# Patient Record
Sex: Female | Born: 1980 | Race: White | Hispanic: No | Marital: Married | Smoking: Former smoker
Health system: Southern US, Community
[De-identification: ages and names within clinical notes are randomized; demographics above are authoritative.]

## PROBLEM LIST (undated history)

## (undated) DIAGNOSIS — I8390 Asymptomatic varicose veins of unspecified lower extremity: Secondary | ICD-10-CM

## (undated) DIAGNOSIS — F32A Depression, unspecified: Secondary | ICD-10-CM

## (undated) DIAGNOSIS — F419 Anxiety disorder, unspecified: Secondary | ICD-10-CM

## (undated) HISTORY — PX: CHOLECYSTECTOMY: SHX55

## (undated) HISTORY — DX: Anxiety disorder, unspecified: F41.9

## (undated) HISTORY — DX: Depression, unspecified: F32.A

## (undated) HISTORY — DX: Asymptomatic varicose veins of unspecified lower extremity: I83.90

---

## 2016-04-18 ENCOUNTER — Encounter: Payer: Self-pay | Admitting: Family Medicine

## 2016-04-18 ENCOUNTER — Ambulatory Visit (INDEPENDENT_AMBULATORY_CARE_PROVIDER_SITE_OTHER): Payer: BLUE CROSS/BLUE SHIELD | Admitting: Family Medicine

## 2016-04-18 VITALS — BP 106/78 | HR 68 | Temp 98.2°F | Resp 16 | Ht 64.0 in | Wt 126.0 lb

## 2016-04-18 DIAGNOSIS — R5383 Other fatigue: Secondary | ICD-10-CM | POA: Insufficient documentation

## 2016-04-18 DIAGNOSIS — I8390 Asymptomatic varicose veins of unspecified lower extremity: Secondary | ICD-10-CM | POA: Insufficient documentation

## 2016-04-18 DIAGNOSIS — N912 Amenorrhea, unspecified: Secondary | ICD-10-CM

## 2016-04-18 DIAGNOSIS — I8393 Asymptomatic varicose veins of bilateral lower extremities: Secondary | ICD-10-CM

## 2016-04-18 DIAGNOSIS — N911 Secondary amenorrhea: Secondary | ICD-10-CM | POA: Insufficient documentation

## 2016-04-18 DIAGNOSIS — Z23 Encounter for immunization: Secondary | ICD-10-CM | POA: Diagnosis not present

## 2016-04-18 DIAGNOSIS — R5382 Chronic fatigue, unspecified: Secondary | ICD-10-CM

## 2016-04-18 DIAGNOSIS — F411 Generalized anxiety disorder: Secondary | ICD-10-CM

## 2016-04-18 DIAGNOSIS — D509 Iron deficiency anemia, unspecified: Secondary | ICD-10-CM

## 2016-04-18 MED ORDER — VENLAFAXINE HCL ER 75 MG PO CP24: 75.0000 mg | ORAL_CAPSULE | Freq: Every day | ORAL | 1 refills | Status: DC

## 2016-04-18 NOTE — Patient Instructions (Addendum)
Thank you for coming in today. Get labs today. It will be about a week before all labs come back. You should hear from Dr. Benay SpiceBarry's office soon. Start Effexor.  Return in 2 weeks.   Venlafaxine extended-release capsules What is this medicine? VENLAFAXINE(VEN la fax een) is used to treat depression, anxiety and panic disorder. This medicine may be used for other purposes; ask your health care provider or pharmacist if you have questions. What should I tell my health care provider before I take this medicine? They need to know if you have any of these conditions: -bleeding disorders -glaucoma -heart disease -high blood pressure -high cholesterol -kidney disease -liver disease -low levels of sodium in the blood -mania or bipolar disorder -seizures -suicidal thoughts, plans, or attempt; a previous suicide attempt by you or a family -take medicines that treat or prevent blood clots -thyroid disease -an unusual or allergic reaction to venlafaxine, desvenlafaxine, other medicines, foods, dyes, or preservatives -pregnant or trying to get pregnant -breast-feeding How should I use this medicine? Take this medicine by mouth with a full glass of water. Follow the directions on the prescription label. Do not cut, crush, or chew this medicine. Take it with food. If needed, the capsule may be carefully opened and the entire contents sprinkled on a spoonful of cool applesauce. Swallow the applesauce/pellet mixture right away without chewing and follow with a glass of water to ensure complete swallowing of the pellets. Try to take your medicine at about the same time each day. Do not take your medicine more often than directed. Do not stop taking this medicine suddenly except upon the advice of your doctor. Stopping this medicine too quickly may cause serious side effects or your condition may worsen. A special MedGuide will be given to you by the pharmacist with each prescription and refill. Be sure to  read this information carefully each time. Talk to your pediatrician regarding the use of this medicine in children. Special care may be needed. Overdosage: If you think you have taken too much of this medicine contact a poison control center or emergency room at once. NOTE: This medicine is only for you. Do not share this medicine with others. What if I miss a dose? If you miss a dose, take it as soon as you can. If it is almost time for your next dose, take only that dose. Do not take double or extra doses. What may interact with this medicine? Do not take this medicine with any of the following medications: -certain medicines for fungal infections like fluconazole, itraconazole, ketoconazole, posaconazole, voriconazole -cisapride -desvenlafaxine -dofetilide -dronedarone -duloxetine -levomilnacipran -linezolid -MAOIs like Carbex, Eldepryl, Marplan, Nardil, and Parnate -methylene blue (injected into a vein) -milnacipran -pimozide -thioridazine -ziprasidone This medicine may also interact with the following medications: -aspirin and aspirin-like medicines -certain medicines for depression, anxiety, or psychotic disturbances -certain medicines for migraine headaches like almotriptan, eletriptan, frovatriptan, naratriptan, rizatriptan, sumatriptan, zolmitriptan -certain medicines for sleep -certain medicines that treat or prevent blood clots like dalteparin, enoxaparin, warfarin -cimetidine -clozapine -diuretics -fentanyl -furazolidone -indinavir -isoniazid -lithium -metoprolol -NSAIDS, medicines for pain and inflammation, like ibuprofen or naproxen -other medicines that prolong the QT interval (cause an abnormal heart rhythm) -procarbazine -rasagiline -supplements like St. John's wort, kava kava, valerian -tramadol -tryptophan This list may not describe all possible interactions. Give your health care provider a list of all the medicines, herbs, non-prescription drugs, or  dietary supplements you use. Also tell them if you smoke, drink alcohol, or use illegal  drugs. Some items may interact with your medicine. What should I watch for while using this medicine? Tell your doctor if your symptoms do not get better or if they get worse. Visit your doctor or health care professional for regular checks on your progress. Because it may take several weeks to see the full effects of this medicine, it is important to continue your treatment as prescribed by your doctor. Patients and their families should watch out for new or worsening thoughts of suicide or depression. Also watch out for sudden changes in feelings such as feeling anxious, agitated, panicky, irritable, hostile, aggressive, impulsive, severely restless, overly excited and hyperactive, or not being able to sleep. If this happens, especially at the beginning of treatment or after a change in dose, call your health care professional. This medicine can cause an increase in blood pressure. Check with your doctor for instructions on monitoring your blood pressure while taking this medicine. You may get drowsy or dizzy. Do not drive, use machinery, or do anything that needs mental alertness until you know how this medicine affects you. Do not stand or sit up quickly, especially if you are an older patient. This reduces the risk of dizzy or fainting spells. Alcohol may interfere with the effect of this medicine. Avoid alcoholic drinks. Your mouth may get dry. Chewing sugarless gum, sucking hard candy and drinking plenty of water will help. Contact your doctor if the problem does not go away or is severe. What side effects may I notice from receiving this medicine? Side effects that you should report to your doctor or health care professional as soon as possible: -allergic reactions like skin rash, itching or hives, swelling of the face, lips, or tongue -breathing problems -changes in vision -hallucination, loss of contact with  reality -seizures -suicidal thoughts or other mood changes -trouble passing urine or change in the amount of urine -unusual bleeding or bruising Side effects that usually do not require medical attention (report to your doctor or health care professional if they continue or are bothersome): -change in sex drive or performance -constipation -increased sweating -loss of appetite -nausea -tremors -weight loss This list may not describe all possible side effects. Call your doctor for medical advice about side effects. You may report side effects to FDA at 1-800-FDA-1088. Where should I keep my medicine? Keep out of the reach of children. Store at a controlled temperature between 20 and 25 degrees C (68 degrees and 77 degrees F), in a dry place. Throw away any unused medicine after the expiration date. NOTE: This sheet is a summary. It may not cover all possible information. If you have questions about this medicine, talk to your doctor, pharmacist, or health care provider.    2016, Elsevier/Gold Standard. (2013-03-11 12:46:03)

## 2016-04-18 NOTE — Progress Notes (Signed)
Krystal ChattersCassandra Hunter is a 35 y.o. female who presents to [facility]: Primary Care Sports Medicine today for establish care and discuss anxiety, amenorrhea, and fatigue as well as varicose veins.  1) anxiety: Patient has a history of intermittent anxiety in the past. Previously she was treated with Zoloft for well however had sexual side effects that were obnoxious. Spent quite some time since she had significant anxiety problem.  She notes feeling irritable and anxious and worrying. She denies any SI or HI. Symptoms are moderate.  2) fatigue: Patient is ongoing fatigue for the last several years. She gets about 8 hours sleep at night and feels well rested. She denies any history of snoring. He notes that her thyroid labs of been checked in the last several years and reportedly normal.  3) amenorrhea: Patient has had several years of amenorrhea. This previously was evaluated by OB/GYN who noted "low FSH and LH" levels but apparently that was the end of the workup. She avoids pregnancy by husband vasectomy.  4) varicose veins: Patient notes several year history of left leg varicose veins. This is itchy and painful at times. She's tried compression at times which have not been helpful.   Past Medical History:  Diagnosis Date  . Anxiety    Past Surgical History:  Procedure Laterality Date  . CHOLECYSTECTOMY     Social History  Substance Use Topics  . Smoking status: Former Games developermoker  . Smokeless tobacco: Never Used  . Alcohol use Not on file   family history includes Thyroid disease in her mother.  ROS as above: No headache, visual changes, nausea, vomiting, diarrhea, constipation, dizziness, abdominal pain, skin rash, fevers, chills, night sweats, weight loss, swollen lymph nodes, body aches, joint swelling, muscle aches, chest pain, shortness of breath, other mood changes, visual or auditory  hallucinations.    Medications: Current Outpatient Prescriptions  Medication Sig Dispense Refill  . glucosamine-chondroitin 500-400 MG tablet Take 1 tablet by mouth 3 (three) times daily.    Marland Kitchen. venlafaxine XR (EFFEXOR XR) 75 MG 24 hr capsule Take 1 capsule (75 mg total) by mouth daily with breakfast. 30 capsule 1   No current facility-administered medications for this visit.    No Known Allergies   Exam:  BP 106/78 (BP Location: Right Arm, Patient Position: Sitting, Cuff Size: Large)   Pulse 68   Temp 98.2 F (36.8 C) (Oral)   Resp 16   Ht 5\' 4"  (1.626 m)   Wt 126 lb (57.2 kg)   LMP  (LMP Unknown) Comment: amenorrhea  SpO2 100%   BMI 21.63 kg/m  Gen: Well NAD Nontoxic appearing HEENT: EOMI,  MMM no goiter Lungs: Normal work of breathing. CTABL Heart: RRR no MRG Abd: NABS, Soft. Nondistended, Nontender Exts: Brisk capillary refill, warm and well perfused.  Psych: Alert and oriented normal speech thought process and affect Skin left leg large varicose vein. Nontender.  GAD 7 : Generalized Anxiety Score 04/18/2016  Nervous, Anxious, on Edge 2  Control/stop worrying 3  Worry too much - different things 2  Trouble relaxing 3  Restless 0  Easily annoyed or irritable 3  Afraid - awful might happen 1  Total GAD 7 Score 14  Anxiety Difficulty Very difficult    Depression screen PHQ 2/9 04/18/2016  Decreased Interest 1  Down, Depressed, Hopeless 0  PHQ - 2 Score 1  Altered sleeping 0  Tired, decreased energy 3  Change in appetite 0  Feeling bad  or failure about yourself  0  Trouble concentrating 1  Moving slowly or fidgety/restless 0  Suicidal thoughts 0  PHQ-9 Score 5     No results found for this or any previous visit (from the past 24 hour(s)). No results found.    Assessment and Plan: 35 y.o. female with  1) anxiety: Likely recurrent generalized anxiety disorder. Discussed options. Plan for trial of Effexor XR and referral to counseling. Check in 2  weeks  2) fatigue: Unclear etiology. Patient is getting plenty of sleep. Check thyroid labs and anemia labs.  3) amenorrhea: Also unclear etiology. Coupled with fatigue concern for possible pituitary problem. Plan to check TSH, free T4 and T3 as well as prolactin and FSH LH and estrogen progesterone.  4) varicose vein left leg: Refer to Dr. Allyson SabalBerry for possible less invasive approach  5) influenza vaccine given prior to discharge  Orders Placed This Encounter  Procedures  . Flu Vaccine QUAD 36+ mos IM  . CBC  . Comprehensive metabolic panel    Order Specific Question:   Has the patient fasted?    Answer:   No  . Hemoglobin A1c  . HIV antibody  . Iron and TIBC  . VITAMIN D 25 Hydroxy (Vit-D Deficiency, Fractures)  . TSH  . T4, free  . T3, free  . Prolactin  . Follicle stimulating hormone  . LH  . Estrogens, total  . Progesterone  . Ambulatory referral to Cardiology    Referral Priority:   Routine    Referral Type:   Consultation    Referral Reason:   Specialty Services Required    Referred to Provider:   Runell GessJonathan J Berry, MD    Requested Specialty:   Cardiology    Number of Visits Requested:   1    Discussed warning signs or symptoms. Please see discharge instructions. Patient expresses understanding.

## 2016-04-19 DIAGNOSIS — D509 Iron deficiency anemia, unspecified: Secondary | ICD-10-CM | POA: Insufficient documentation

## 2016-04-19 LAB — PROGESTERONE

## 2016-04-19 LAB — COMPREHENSIVE METABOLIC PANEL
ALBUMIN: 4.3 g/dL (ref 3.6–5.1)
ALT: 32 U/L — ABNORMAL HIGH (ref 6–29)
AST: 32 U/L — AB (ref 10–30)
Alkaline Phosphatase: 61 U/L (ref 33–115)
BILIRUBIN TOTAL: 0.5 mg/dL (ref 0.2–1.2)
BUN: 14 mg/dL (ref 7–25)
CALCIUM: 8.7 mg/dL (ref 8.6–10.2)
CO2: 24 mmol/L (ref 20–31)
Chloride: 102 mmol/L (ref 98–110)
Creat: 0.84 mg/dL (ref 0.50–1.10)
GLUCOSE: 98 mg/dL (ref 65–99)
POTASSIUM: 4.3 mmol/L (ref 3.5–5.3)
Sodium: 137 mmol/L (ref 135–146)
Total Protein: 6.9 g/dL (ref 6.1–8.1)

## 2016-04-19 LAB — CBC
HEMATOCRIT: 34.2 % — AB (ref 35.0–45.0)
HEMOGLOBIN: 10.7 g/dL — AB (ref 11.7–15.5)
MCH: 26.7 pg — AB (ref 27.0–33.0)
MCHC: 31.3 g/dL — AB (ref 32.0–36.0)
MCV: 85.3 fL (ref 80.0–100.0)
MPV: 9.2 fL (ref 7.5–12.5)
Platelets: 398 10*3/uL (ref 140–400)
RBC: 4.01 MIL/uL (ref 3.80–5.10)
RDW: 14.6 % (ref 11.0–15.0)
WBC: 4.1 10*3/uL (ref 3.8–10.8)

## 2016-04-19 LAB — TSH: TSH: 1.04 mIU/L

## 2016-04-19 LAB — VITAMIN D 25 HYDROXY (VIT D DEFICIENCY, FRACTURES): Vit D, 25-Hydroxy: 43 ng/mL (ref 30–100)

## 2016-04-19 LAB — IRON AND TIBC
%SAT: 5 % — ABNORMAL LOW (ref 11–50)
IRON: 29 ug/dL — AB (ref 40–190)
TIBC: 576 ug/dL — AB (ref 250–450)
UIBC: 547 ug/dL — ABNORMAL HIGH (ref 125–400)

## 2016-04-19 LAB — HEMOGLOBIN A1C
Hgb A1c MFr Bld: 5.1 % (ref <5.7)
MEAN PLASMA GLUCOSE: 100 mg/dL

## 2016-04-19 LAB — HIV ANTIBODY (ROUTINE TESTING W REFLEX): HIV: NONREACTIVE

## 2016-04-19 LAB — PROLACTIN: Prolactin: 7.6 ng/mL

## 2016-04-19 LAB — FOLLICLE STIMULATING HORMONE: FSH: 6.1 m[IU]/mL

## 2016-04-19 LAB — T3, FREE: T3 FREE: 2.6 pg/mL (ref 2.3–4.2)

## 2016-04-19 LAB — T4, FREE: FREE T4: 1 ng/dL (ref 0.8–1.8)

## 2016-04-19 LAB — LUTEINIZING HORMONE: LH: 0.8 m[IU]/mL

## 2016-04-22 LAB — ESTROGENS, TOTAL: ESTROGEN: 71.9 pg/mL

## 2016-04-24 NOTE — Addendum Note (Signed)
Addended by: Rodolph BongOREY, EVAN S on: 04/24/2016 08:31 AM   Modules accepted: Orders

## 2016-04-26 ENCOUNTER — Other Ambulatory Visit (INDEPENDENT_AMBULATORY_CARE_PROVIDER_SITE_OTHER): Payer: BLUE CROSS/BLUE SHIELD

## 2016-04-26 DIAGNOSIS — D509 Iron deficiency anemia, unspecified: Secondary | ICD-10-CM

## 2016-04-26 DIAGNOSIS — N911 Secondary amenorrhea: Secondary | ICD-10-CM

## 2016-04-26 LAB — FERRITIN: Ferritin: 3 ng/mL — ABNORMAL LOW (ref 10–154)

## 2016-04-26 LAB — POC HEMOCCULT BLD/STL (HOME/3-CARD/SCREEN)
Card #2 Fecal Occult Blod, POC: NEGATIVE
FECAL OCCULT BLD: NEGATIVE
FECAL OCCULT BLD: NEGATIVE

## 2016-04-26 LAB — TESTOSTERONE: TESTOSTERONE: 22 ng/dL

## 2016-04-26 MED ORDER — LORAZEPAM 0.5 MG PO TABS: ORAL_TABLET | ORAL | 0 refills | Status: DC

## 2016-04-26 NOTE — Progress Notes (Signed)
I called the patient and discussed lab results and plan.  Secondary amenorrhea: Plan for brain MRI. Patient has claustrophobia will use Ativan.  follow-up after MRI   Anemia: Unclear etiology. Plan to try oral iron sulfate

## 2016-04-26 NOTE — Addendum Note (Signed)
Addended by: Rodolph BongOREY, Taeja Debellis S on: 04/26/2016 01:11 PM   Modules accepted: Orders

## 2016-05-02 ENCOUNTER — Encounter: Payer: Self-pay | Admitting: Family Medicine

## 2016-05-08 ENCOUNTER — Ambulatory Visit (INDEPENDENT_AMBULATORY_CARE_PROVIDER_SITE_OTHER): Payer: BLUE CROSS/BLUE SHIELD

## 2016-05-08 DIAGNOSIS — N911 Secondary amenorrhea: Secondary | ICD-10-CM | POA: Diagnosis not present

## 2016-05-08 MED ORDER — GADOBENATE DIMEGLUMINE 529 MG/ML IV SOLN
10.0000 mL | Freq: Once | INTRAVENOUS | Status: AC | PRN
  Administered 2016-05-08: 10 mL via INTRAVENOUS

## 2016-05-09 ENCOUNTER — Ambulatory Visit (INDEPENDENT_AMBULATORY_CARE_PROVIDER_SITE_OTHER): Payer: BLUE CROSS/BLUE SHIELD | Admitting: Cardiovascular Disease

## 2016-05-09 ENCOUNTER — Encounter: Payer: Self-pay | Admitting: Cardiovascular Disease

## 2016-05-09 VITALS — BP 125/86 | HR 62 | Ht 64.0 in | Wt 125.5 lb

## 2016-05-09 DIAGNOSIS — I8390 Asymptomatic varicose veins of unspecified lower extremity: Secondary | ICD-10-CM

## 2016-05-09 DIAGNOSIS — I868 Varicose veins of other specified sites: Secondary | ICD-10-CM

## 2016-05-09 NOTE — Patient Instructions (Signed)
Medication Instructions:  Your physician recommends that you continue on your current medications as directed. Please refer to the Current Medication list given to you today.  Labwork: NONE ORDERED  Testing/Procedures: NONE ORDERED  Follow-Up: NONE  Any Other Special Instructions Will Be Listed Below (If Applicable).  NO CHARGE FOR TODAYS VISIT   REFERRAL TO DR EARLY AT [facility]   If you need a refill on your cardiac medications before your next appointment, please call your pharmacy.

## 2016-05-09 NOTE — Progress Notes (Signed)
Ms Krystal Hunter was referred here for varicose veins. I informed her that we did not treat those here in our practice but I will refer her to Dr. Arbie CookeyEarly at [facility] for further evaluation.

## 2016-05-11 ENCOUNTER — Ambulatory Visit (INDEPENDENT_AMBULATORY_CARE_PROVIDER_SITE_OTHER): Payer: BLUE CROSS/BLUE SHIELD | Admitting: Family Medicine

## 2016-05-11 VITALS — BP 128/87 | HR 71 | Wt 121.0 lb

## 2016-05-11 DIAGNOSIS — R930 Abnormal findings on diagnostic imaging of skull and head, not elsewhere classified: Secondary | ICD-10-CM | POA: Diagnosis not present

## 2016-05-11 DIAGNOSIS — N911 Secondary amenorrhea: Secondary | ICD-10-CM | POA: Diagnosis not present

## 2016-05-11 DIAGNOSIS — R5382 Chronic fatigue, unspecified: Secondary | ICD-10-CM

## 2016-05-11 DIAGNOSIS — Z23 Encounter for immunization: Secondary | ICD-10-CM | POA: Diagnosis not present

## 2016-05-11 DIAGNOSIS — R9089 Other abnormal findings on diagnostic imaging of central nervous system: Secondary | ICD-10-CM

## 2016-05-11 DIAGNOSIS — D509 Iron deficiency anemia, unspecified: Secondary | ICD-10-CM

## 2016-05-11 NOTE — Addendum Note (Signed)
Addended by: Minna AntisBRIGHAM, EBONY T on: 05/11/2016 11:50 AM   Modules accepted: Orders

## 2016-05-11 NOTE — Progress Notes (Signed)
Krystal Hunter is a 35 y.o. female who presents to [name]: Primary Care Sports Medicine today for follow-up fatigue, secondary amenorrhea, and iron deficiency.  Secondary amenorrhea. Patient has had a workup so far that shows low estrogen and progesterone levels with low or normal low LH and FSH levels. She is part of the workup had an MRI of her brain which did not show any pituitary abnormalities. She does have some occasions and a ganglion in her brain concerning for nonspecific etiologies.  She notes she occasionally gets night sweats and hot flashes.  Fatigue: Patient has had significant improvement in symptoms with Effexor. She is feeling much better.  Iron deficiency: Unclear etiology was negative Hemoccult workup. Patient is tolerating 3 times a day oral iron well. She notes her ability to exercise and exert herself has improved.   Past Medical History:  Diagnosis Date  . Anxiety    Past Surgical History:  Procedure Laterality Date  . CHOLECYSTECTOMY     Social History  Substance Use Topics  . Smoking status: Former Games developer  . Smokeless tobacco: Never Used  . Alcohol use Not on file   family history includes Thyroid disease in her mother.  ROS as above:  Medications: Current Outpatient Prescriptions  Medication Sig Dispense Refill  . ferrous sulfate 325 (65 FE) MG tablet Take 325 mg by mouth 3 (three) times daily with meals.    Marland Kitchen glucosamine-chondroitin 500-400 MG tablet Take 1 tablet by mouth 3 (three) times daily.    Marland Kitchen venlafaxine XR (EFFEXOR XR) 75 MG 24 hr capsule Take 1 capsule (75 mg total) by mouth daily with breakfast. 30 capsule 1   No current facility-administered medications for this visit.    No Known Allergies   Exam:  BP 128/87   Pulse 71   Wt 121 lb (54.9 kg)   LMP  (LMP Unknown) Comment: amenorrhea  BMI 20.77 kg/m  Gen: Well NAD HEENT: EOMI,   MMM Lungs: Normal work of breathing. CTABL Heart: RRR no MRG Abd: NABS, Soft. Nondistended, Nontender Exts: Brisk capillary refill, warm and well perfused.   Mr Laqueta Jean Wo Contrast  Result Date: 05/08/2016 CLINICAL DATA:  Secondary amenorrhea. EXAM: MRI HEAD WITHOUT AND WITH CONTRAST TECHNIQUE: Multiplanar, multiecho pulse sequences of the brain and surrounding structures were obtained without and with intravenous contrast. CONTRAST:  10mL MULTIHANCE GADOBENATE DIMEGLUMINE 529 MG/ML IV SOLN COMPARISON:  None. FINDINGS: There is no evidence of acute infarct, intracranial hemorrhage, mass, midline shift, or extra-axial fluid collection. The ventricles and sulci are normal. There is symmetric intrinsic T1 hyperintensity in the globi pallidi. The brain is otherwise normal in signal. No abnormal enhancement is identified. Orbits are unremarkable. A left maxillary sinus mucous retention cyst is noted. The mastoid air cells are clear. Major intracranial vascular flow voids are preserved. Bone marrow signal is unremarkable. Dedicated imaging was performed through the sella turcica to evaluate the pituitary gland. A normal posterior pituitary bright spot is present. The pituitary is normal in size and contour and demonstrates homogeneous enhancement without focal abnormality identified. The infundibulum is midline. The optic chiasm is unremarkable. IMPRESSION: 1. Unremarkable appearance of the pituitary gland. 2. Symmetric T1 hyperintensity in the globi pallidi. This is nonspecific but can be seen in the setting of liver dysfunction, manganese deposition from long-term parenteral nutrition, Wilson's disease, calcium deposition, and toxic insults. Electronically Signed   By: Sebastian Ache M.D.   On: 05/08/2016 15:11  No results found for this or any previous visit (from the past 24 hour(s)). No results found.    Assessment and Plan: 35 y.o. female with   Iron deficiency:  Continue oral iron. Recheck iron  stores in 2 months  Secondary amenorrhea: Unclear etiology. At this point refer to OB/GYN as patient is symptomatic. She may benefit from hormone replacement therapy or further workup.  Abnormal MRI: Likely nonspecific and not associated with any other etiology. Probably incidental finding that does not mean anything. However will check for Wilson's disease.  Fatigue/mood: Continue Effexor. Patient seems to be improved.   Orders Placed This Encounter  Procedures  . CBC  . Ferritin  . Iron and TIBC  . Ceruloplasmin  . Ambulatory referral to Obstetrics / Gynecology    Referral Priority:   Routine    Referral Type:   Consultation    Referral Reason:   Specialty Services Required    Requested Specialty:   Obstetrics and Gynecology    Number of Visits Requested:   1    Discussed warning signs or symptoms. Please see discharge instructions. Patient expresses understanding.

## 2016-05-11 NOTE — Patient Instructions (Signed)
Thank you for coming in today. Get labs in 2 months.   Attend OBGYN.

## 2016-05-23 ENCOUNTER — Other Ambulatory Visit: Payer: Self-pay | Admitting: *Deleted

## 2016-05-23 ENCOUNTER — Encounter: Payer: Self-pay | Admitting: Vascular Surgery

## 2016-05-23 DIAGNOSIS — I83892 Varicose veins of left lower extremities with other complications: Secondary | ICD-10-CM

## 2016-05-31 ENCOUNTER — Encounter: Payer: Self-pay | Admitting: Obstetrics & Gynecology

## 2016-05-31 ENCOUNTER — Ambulatory Visit (HOSPITAL_COMMUNITY): Payer: BLUE CROSS/BLUE SHIELD | Admitting: Licensed Clinical Social Worker

## 2016-05-31 ENCOUNTER — Ambulatory Visit (INDEPENDENT_AMBULATORY_CARE_PROVIDER_SITE_OTHER): Payer: BLUE CROSS/BLUE SHIELD | Admitting: Obstetrics & Gynecology

## 2016-05-31 ENCOUNTER — Encounter: Payer: Self-pay | Admitting: Family Medicine

## 2016-05-31 VITALS — Ht 64.0 in | Wt 121.0 lb

## 2016-05-31 DIAGNOSIS — Z124 Encounter for screening for malignant neoplasm of cervix: Secondary | ICD-10-CM | POA: Diagnosis not present

## 2016-05-31 DIAGNOSIS — Z1151 Encounter for screening for human papillomavirus (HPV): Secondary | ICD-10-CM | POA: Diagnosis not present

## 2016-05-31 DIAGNOSIS — Z01419 Encounter for gynecological examination (general) (routine) without abnormal findings: Secondary | ICD-10-CM | POA: Diagnosis not present

## 2016-05-31 NOTE — Progress Notes (Signed)
Subjective:    Krystal Hunter is a 35 y.o. MWP2 (4 and 216 yo kids)  female who presents for an annual exam. She has menarche at abotu 35 yo. Periods were monthly for years, also on OCPS for years. Used mini pill post partum, then husband got a vasectomy. She went off of the pills and had no more pills naturally. She has had a period while on OCPs. She has had hot flashes, night sweats since about 2014. Also bad mood and fatigue.  The patient is sexually active. GYN screening history: last pap: was normal. The patient wears seatbelts: yes. The patient participates in regular exercise: yes. Has the patient ever been transfused or tattooed?: yes. The patient reports that there is not domestic violence in her life.   Menstrual History: OB History    Gravida Para Term Preterm AB Living   2 2 2     2    SAB TAB Ectopic Multiple Live Births                  Menarche age: [age] Patient's last menstrual period was 05/31/2014 (approximate).    The following portions of the patient's history were reviewed and updated as appropriate: allergies, current medications, past family history, past medical history, past social history, past surgical history and problem list.  Review of Systems Pertinent items are noted in HPI. Homemaker. Had flu vaccine already this season Married for 10 years, denies dyspareunia. Uses a lube due to vaginal dryness FH- negative for breast/gyn/colon cancer   Objective:    Ht 5\' 4"  (1.626 m)   Wt 121 lb (54.9 kg)   LMP 05/31/2014 (Approximate)   BMI 20.77 kg/m   General Appearance:    Alert, cooperative, no distress, appears stated age  Head:    Normocephalic, without obvious abnormality, atraumatic  Eyes:    PERRL, conjunctiva/corneas clear, EOM's intact, fundi    benign, both eyes  Ears:    Normal TM's and external ear canals, both ears  Nose:   Nares normal, septum midline, mucosa normal, no drainage    or sinus tenderness  Throat:   Lips, mucosa, and tongue normal;  teeth and gums normal  Neck:   Supple, symmetrical, trachea midline, no adenopathy;    thyroid:  no enlargement/tenderness/nodules; no carotid   bruit or JVD  Back:     Symmetric, no curvature, ROM normal, no CVA tenderness  Lungs:     Clear to auscultation bilaterally, respirations unlabored  Chest Wall:    No tenderness or deformity   Heart:    Regular rate and rhythm, S1 and S2 normal, no murmur, rub   or gallop  Breast Exam:    No tenderness, masses, or nipple abnormality  Abdomen:     Soft, non-tender, bowel sounds active all four quadrants,    no masses, no organomegaly  Genitalia:    Normal female without lesion, discharge or tenderness, moderate vulvovaginal atrophy, NSSR, NT, no palpable adnexal masses     Extremities:   Extremities normal, atraumatic, no cyanosis or edema  Pulses:   2+ and symmetric all extremities  Skin:   Skin color, texture, turgor normal, no rashes or lesions  Lymph nodes:   Cervical, supraclavicular, and axillary nodes normal  Neurologic:   CNII-XII intact, normal strength, sensation and reflexes    throughout   .    Assessment:    Healthy female exam.   Secondary amenorrhea (I suspect early onset menopause although her FSH is still normal).  Plan:     Thin prep Pap smear. with cotesting Refer to Dr. Collier Bullock

## 2016-06-01 ENCOUNTER — Other Ambulatory Visit: Payer: Self-pay

## 2016-06-01 DIAGNOSIS — F411 Generalized anxiety disorder: Secondary | ICD-10-CM

## 2016-06-01 LAB — CYTOLOGY - PAP

## 2016-06-01 MED ORDER — VENLAFAXINE HCL ER 75 MG PO CP24: 75.0000 mg | ORAL_CAPSULE | Freq: Every day | ORAL | 1 refills | Status: DC

## 2016-06-06 ENCOUNTER — Encounter: Payer: Self-pay | Admitting: Family Medicine

## 2016-06-07 MED ORDER — AMOXICILLIN 500 MG PO CAPS: 500.0000 mg | ORAL_CAPSULE | Freq: Three times a day (TID) | ORAL | 0 refills | Status: DC

## 2016-06-27 ENCOUNTER — Encounter: Payer: BLUE CROSS/BLUE SHIELD | Admitting: Vascular Surgery

## 2016-06-27 ENCOUNTER — Encounter (HOSPITAL_COMMUNITY): Payer: BLUE CROSS/BLUE SHIELD

## 2016-07-11 ENCOUNTER — Encounter: Payer: BLUE CROSS/BLUE SHIELD | Admitting: Vascular Surgery

## 2016-07-11 ENCOUNTER — Encounter (HOSPITAL_COMMUNITY): Payer: BLUE CROSS/BLUE SHIELD

## 2016-07-19 ENCOUNTER — Encounter: Payer: Self-pay | Admitting: Vascular Surgery

## 2016-07-26 ENCOUNTER — Encounter: Payer: Self-pay | Admitting: Vascular Surgery

## 2016-07-26 ENCOUNTER — Ambulatory Visit (HOSPITAL_COMMUNITY)
Admission: RE | Admit: 2016-07-26 | Discharge: 2016-07-26 | Disposition: A | Discharge status: Home / Self Care | Payer: BLUE CROSS/BLUE SHIELD | Source: Ambulatory Visit | Attending: Vascular Surgery | Admitting: Vascular Surgery

## 2016-07-26 ENCOUNTER — Ambulatory Visit (INDEPENDENT_AMBULATORY_CARE_PROVIDER_SITE_OTHER): Payer: BLUE CROSS/BLUE SHIELD | Admitting: Vascular Surgery

## 2016-07-26 VITALS — BP 114/77 | HR 74 | Temp 98.7°F | Resp 18 | Ht 64.0 in | Wt 123.1 lb

## 2016-07-26 DIAGNOSIS — I83892 Varicose veins of left lower extremities with other complications: Secondary | ICD-10-CM | POA: Diagnosis not present

## 2016-07-26 NOTE — Progress Notes (Signed)
[facility]  Patient name: Krystal ChattersCassandra Hunter MRN: [id number] DOB: Oct 14, 1980 Sex: female  REASON FOR CONSULT: Evaluation of painful left leg varicose vein  HPI: Krystal ChattersCassandra Weismann is a 35 y.o. female, who is seen today for evaluation of left leg varicose veins. She is very pleasant [age]-year-old reports this initially became apparent after the delivery of her second child. It has been progressive since that time. She has a very large varix that extends across the anterior surface of her thigh her lateral knee and into her lateral calf. This is more prominent with prolonged standing and causes discomfort over the vein with prolonged standing as well. DVT and no history of bleeding from her varicosities. Of interest she did have a grandmother who did have a significant bleed from varices and she is concerned regarding the possibility of this as well.  Past Medical History:  Diagnosis Date  . Anxiety   . Varicose veins     Family History  Problem Relation Age of Onset  . Thyroid disease Mother     SOCIAL HISTORY: Social History   Social History  . Marital status: Married    Spouse name: N/A  . Number of children: N/A  . Years of education: N/A   Occupational History  . Not on file.   Social History Main Topics  . Smoking status: Former Games developermoker  . Smokeless tobacco: Never Used  . Alcohol use Yes     Comment: Occ  . Drug use: No  . Sexual activity: Yes     Comment: Husband has vasectomy   Other Topics Concern  . Not on file   Social History Narrative  . No narrative on file    No Known Allergies  Current Outpatient Prescriptions  Medication Sig Dispense Refill  . ferrous sulfate 325 (65 FE) MG tablet Take 325 mg by mouth 3 (three) times daily with meals.    . venlafaxine XR (EFFEXOR XR) 75 MG 24 hr capsule Take 1 capsule (75 mg total) by mouth daily with breakfast. 90 capsule 1  . amoxicillin (AMOXIL) 500  MG capsule Take 1 capsule (500 mg total) by mouth 3 (three) times daily. (Patient not taking: Reported on 07/26/2016) 21 capsule 0  . glucosamine-chondroitin 500-400 MG tablet Take 1 tablet by mouth 3 (three) times daily.     No current facility-administered medications for this visit.     REVIEW OF SYSTEMS:  [X]  denotes positive finding, [ ]  denotes negative finding Cardiac  Comments:  Chest pain or chest pressure:    Shortness of breath upon exertion:    Short of breath when lying flat:    Irregular heart rhythm:        Vascular    Pain in calf, thigh, or hip brought on by ambulation: x   Pain in feet at night that wakes you up from your sleep:     Blood clot in your veins:    Leg swelling:         Pulmonary    Oxygen at home:    Productive cough:     Wheezing:         Neurologic    Sudden weakness in arms or legs:     Sudden numbness in arms or legs:     Sudden onset of difficulty speaking or slurred speech:    Temporary loss of vision in one eye:     Problems with dizziness:  Gastrointestinal    Blood in stool:     Vomited blood:         Genitourinary    Burning when urinating:     Blood in urine:        Psychiatric    Major depression:         Hematologic    Bleeding problems:    Problems with blood clotting too easily:        Skin    Rashes or ulcers:        Constitutional    Fever or chills:      PHYSICAL EXAM: Vitals:   07/26/16 1041  BP: 114/77  Pulse: 74  Resp: 18  Temp: 98.7 F (37.1 C)  TempSrc: Oral  SpO2: 98%  Weight: 123 lb 1.6 oz (55.8 kg)  Height: 5\' 4"  (1.626 m)    GENERAL: The patient is a well-nourished female, in no acute distress. The vital signs are documented above. CARDIOVASCULAR: Plus dorsalis pedis pulses bilaterally PULMONARY: There is good air exchange  ABDOMEN: Soft and non-tender  MUSCULOSKELETAL: There are no major deformities or cyanosis. NEUROLOGIC: No focal weakness or paresthesias are detected. SKIN:  There are no ulcers or rashes noted. PSYCHIATRIC: The patient has a normal affect. Large varices extending from her proximal medial thigh across her anterior thigh lateral knee and lateral calf No changes of hemosiderin deposits or pigments around her left ankle Darkening of the skin overlying her varices DATA:  He does have reflux to anterior accessory branch of her saphenous vein extending into the varices. Some reflux in her left popliteal vein and no evidence of DVT or other reflux  MEDICAL ISSUES: I discussed these findings in detail with patient. 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 Explained that this is not dangerous and does not put her at increased risk for DVT. Explained that this pattern of disease to be very unlikely to have bleeding similar to her grandmother's situation. She has not tried compression garments for relief of the pain overlying her varicosities. We have measured today and we'll fit her for thigh-high graduated compression garments. She will elevate her legs when possible and also use ibuprofen for discomfort as needed. We will see her again in 3 months for continued evaluation. She would be a candidate for ablation of her anterior accessory branch and stab phlebectomy of multiple tributary varicosities   Larina Earthlyodd F. Kavin Weckwerth, MD FACS [facility] Tel [phone] Pager [phone]

## 2016-10-24 ENCOUNTER — Encounter: Payer: Self-pay | Admitting: Vascular Surgery

## 2016-10-31 ENCOUNTER — Ambulatory Visit: Payer: BLUE CROSS/BLUE SHIELD | Admitting: Vascular Surgery

## 2017-01-24 ENCOUNTER — Other Ambulatory Visit: Payer: Self-pay | Admitting: Family Medicine

## 2017-01-24 DIAGNOSIS — F411 Generalized anxiety disorder: Secondary | ICD-10-CM

## 2017-08-07 ENCOUNTER — Other Ambulatory Visit: Payer: Self-pay | Admitting: Family Medicine

## 2017-08-07 DIAGNOSIS — F411 Generalized anxiety disorder: Secondary | ICD-10-CM

## 2017-09-04 ENCOUNTER — Ambulatory Visit (INDEPENDENT_AMBULATORY_CARE_PROVIDER_SITE_OTHER): Payer: Managed Care, Other (non HMO) | Admitting: Family Medicine

## 2017-09-04 ENCOUNTER — Encounter: Payer: Self-pay | Admitting: Family Medicine

## 2017-09-04 VITALS — BP 126/82 | HR 66 | Ht 64.0 in | Wt 130.0 lb

## 2017-09-04 DIAGNOSIS — F411 Generalized anxiety disorder: Secondary | ICD-10-CM

## 2017-09-04 DIAGNOSIS — Z0000 Encounter for general adult medical examination without abnormal findings: Secondary | ICD-10-CM | POA: Diagnosis not present

## 2017-09-04 DIAGNOSIS — Z23 Encounter for immunization: Secondary | ICD-10-CM | POA: Diagnosis not present

## 2017-09-04 MED ORDER — VENLAFAXINE HCL ER 75 MG PO CP24: ORAL_CAPSULE | ORAL | 0 refills | Status: DC

## 2017-09-04 NOTE — Patient Instructions (Signed)
Thank you for coming in today. Restart Effexor.  If not doing well we can add therapy. You can also do self guided CBT.  Continue exercise.  Recheck in 6-12 months or sooner if needed.

## 2017-09-04 NOTE — Progress Notes (Signed)
Krystal Hunter is a 37 y.o. female who presents to [name]: Primary Care Sports Medicine today for well adult visit.   Krystal Hunter is doing well overall. She notes only anxiety as an issue. She was doing well with effexor and stopped it a few months ago. He symptoms returned and she restarted it a few weeks ago. She is feeling better but is interested in things she can do in addition to medicine to help control anxiety.  She has never done counseling or therapy in the past.  She has tried meditation but found it very challenging.  She finds that exercise tends to help her symptoms quite a bit.   Past Medical History:  Diagnosis Date  . Anxiety   . Varicose veins    Past Surgical History:  Procedure Laterality Date  . CHOLECYSTECTOMY     Social History   Tobacco Use  . Smoking status: Former Games developer  . Smokeless tobacco: Never Used  Substance Use Topics  . Alcohol use: Yes    Comment: Occ   family history includes Thyroid disease in her mother.  ROS as above:  Medications: Current Outpatient Medications  Medication Sig Dispense Refill  . ferrous sulfate 325 (65 FE) MG tablet Take 325 mg by mouth 3 (three) times daily with meals.    Marland Kitchen glucosamine-chondroitin 500-400 MG tablet Take 1 tablet by mouth 3 (three) times daily.    Marland Kitchen venlafaxine XR (EFFEXOR-XR) 75 MG 24 hr capsule TAKE 1 CAPSULE DAILY WITH BREAKFAST 90 capsule 0   No current facility-administered medications for this visit.    No Known Allergies  Health Maintenance Health Maintenance  Topic Date Due  . PAP SMEAR  06/01/2019  . TETANUS/TDAP  05/11/2026  . INFLUENZA VACCINE  Completed  . HIV Screening  Completed     Exam:  BP 126/82   Pulse 66   Ht 5\' 4"  (1.626 m)   Wt 130 lb (59 kg)   BMI 22.31 kg/m  Gen: Well NAD HEENT: EOMI,  MMM Lungs: Normal work of breathing. CTABL Heart: RRR no MRG Abd: NABS, Soft.  Nondistended, Nontender Exts: Brisk capillary refill, warm and well perfused.  Psych alert and oriented normal speech thought process and affect.  No SI or HI expressed.  Depression screen [facility] 2/9 09/04/2017 09/04/2017 04/18/2016  Decreased Interest - 0 1  Down, Depressed, Hopeless 0 0 0  PHQ - 2 Score 0 0 1  Altered sleeping 0 - 0  Tired, decreased energy 0 - 3  Change in appetite 0 - 0  Feeling bad or failure about yourself  0 - 0  Trouble concentrating 0 - 1  Moving slowly or fidgety/restless 0 - 0  Suicidal thoughts 0 - 0  PHQ-9 Score 0 - 5  Difficult doing work/chores Not difficult at all - -   GAD 7 : Generalized Anxiety Score 09/04/2017 04/18/2016  Nervous, Anxious, on Edge 1 2  Control/stop worrying 0 3  Worry too much - different things 2 2  Trouble relaxing 1 3  Restless 1 0  Easily annoyed or irritable 1 3  Afraid - awful might happen 1 1  Total GAD 7 Score 7 14  Anxiety Difficulty Somewhat difficult Very difficult      No results found for this or any previous visit (from the past 72 hour(s)). No results found.    Assessment and Plan: 36 y.o. female with well adult.  Doing quite well.  We had  a lengthy discussion about anxiety management.  Plan to restart Effexor and titrate as needed.  Also recommend continued exercising and self guided cognitive behavioral therapy.  Consider referral to counseling or therapy as needed.  Recheck in 6-12 months or sooner if needed.  Health maintenance up-to-date.  Influenza vaccine given today.   Orders Placed This Encounter  Procedures  . Flu Vaccine QUAD 36+ mos IM    [facility]   Meds ordered this encounter  Medications  . DISCONTD: venlafaxine XR (EFFEXOR-XR) 75 MG 24 hr capsule    Sig: TAKE 1 CAPSULE DAILY WITH BREAKFAST    Dispense:  30 capsule    Refill:  0  . venlafaxine XR (EFFEXOR-XR) 75 MG 24 hr capsule    Sig: TAKE 1 CAPSULE DAILY WITH BREAKFAST    Dispense:  90 capsule    Refill:  0     Discussed warning  signs or symptoms. Please see discharge instructions. Patient expresses understanding.

## 2017-11-05 ENCOUNTER — Encounter: Payer: Self-pay | Admitting: Family Medicine

## 2017-11-05 DIAGNOSIS — F411 Generalized anxiety disorder: Secondary | ICD-10-CM

## 2017-11-05 MED ORDER — VENLAFAXINE HCL ER 150 MG PO CP24: 150.0000 mg | ORAL_CAPSULE | Freq: Every day | ORAL | 0 refills | Status: DC

## 2017-11-22 ENCOUNTER — Telehealth: Payer: Self-pay | Admitting: Family Medicine

## 2017-11-22 NOTE — Telephone Encounter (Signed)
Left detailed message on patient vm with results as noted below. Tayari Yankee,CMA  

## 2017-11-22 NOTE — Telephone Encounter (Signed)
Yes. I have sent a prescription for Effexor 150 to your pharmacy.  Please increase to 150.  Make a follow up appointment with me soonish.   Pt did not read mychart message

## 2017-12-13 ENCOUNTER — Encounter: Payer: Self-pay | Admitting: Family Medicine

## 2017-12-13 DIAGNOSIS — F411 Generalized anxiety disorder: Secondary | ICD-10-CM

## 2017-12-13 MED ORDER — VENLAFAXINE HCL ER 150 MG PO CP24: 150.0000 mg | ORAL_CAPSULE | Freq: Every day | ORAL | 0 refills | Status: DC

## 2018-02-26 ENCOUNTER — Encounter: Payer: Self-pay | Admitting: Family Medicine

## 2018-02-26 ENCOUNTER — Ambulatory Visit (INDEPENDENT_AMBULATORY_CARE_PROVIDER_SITE_OTHER): Payer: Managed Care, Other (non HMO) | Admitting: Family Medicine

## 2018-02-26 VITALS — BP 106/63 | HR 77 | Ht 64.0 in | Wt 127.0 lb

## 2018-02-26 DIAGNOSIS — F411 Generalized anxiety disorder: Secondary | ICD-10-CM | POA: Diagnosis not present

## 2018-02-26 DIAGNOSIS — D509 Iron deficiency anemia, unspecified: Secondary | ICD-10-CM

## 2018-02-26 MED ORDER — VENLAFAXINE HCL ER 150 MG PO CP24: 150.0000 mg | ORAL_CAPSULE | Freq: Every day | ORAL | 3 refills | Status: DC

## 2018-02-26 NOTE — Progress Notes (Signed)
Krystal Hunter is a 37 y.o. female who presents to [name]: Primary Care Sports Medicine today for follow-up anxiety.  Krystal Hunter is doing well with Effexor for anxiety.  She tolerates the medication well and notes that her anxiety is quite well controlled. Notes that she exercises regularly as a way to manage her anxiety symptoms.  She runs about 3-1/2 miles a day 5 to 6 days a week.  She feels quite well with this regimen and denies any injury.  She notes that she takes oral iron for her history of iron deficiency but would like to avoid further testing if possible she feels well with no fatigue or anemia symptoms.  She does not use sunscreen regularly.   ROS as above:  Exam:  BP 106/63   Pulse 77   Ht 5\' 4"  (1.626 m)   Wt 127 lb (57.6 kg)   BMI 21.80 kg/m  Gen: Well NAD HEENT: EOMI,  MMM Lungs: Normal work of breathing. CTABL Heart: RRR no MRG Abd: NABS, Soft. Nondistended, Nontender Exts: Brisk capillary refill, warm and well perfused.  Skin: Multiple freckles with some skin damage present. Psych alert and oriented normal speech thought process and affect.  Depression screen [facility] 2/9 02/26/2018 09/04/2017 09/04/2017 04/18/2016  Decreased Interest 0 - 0 1  Down, Depressed, Hopeless 0 0 0 0  PHQ - 2 Score 0 0 0 1  Altered sleeping 0 0 - 0  Tired, decreased energy 0 0 - 3  Change in appetite 0 0 - 0  Feeling bad or failure about yourself  0 0 - 0  Trouble concentrating 0 0 - 1  Moving slowly or fidgety/restless 0 0 - 0  Suicidal thoughts 0 0 - 0  PHQ-9 Score 0 0 - 5  Difficult doing work/chores Not difficult at all Not difficult at all - -   GAD 7 : Generalized Anxiety Score 02/26/2018 09/04/2017 04/18/2016  Nervous, Anxious, on Edge 0 1 2  Control/stop worrying 0 0 3  Worry too much - different things 0 2 2  Trouble relaxing 1 1 3   Restless 1 1 0  Easily annoyed or irritable 1 1 3     Afraid - awful might happen 0 1 1  Total GAD 7 Score 3 7 14   Anxiety Difficulty Not difficult at all Somewhat difficult Very difficult      Assessment and Plan: 37 y.o. female with  Anxiety depression doing well.  Continue Effexor recheck yearly.  Iron deficiency: Continue oral iron.  Consider recheck in the near future.  Recommend regular sunscreen and avoiding ultraviolet exposure if possible.   No orders of the defined types were placed in this encounter.  No orders of the defined types were placed in this encounter.    Historical information moved to improve visibility of documentation.  Past Medical History:  Diagnosis Date  . Anxiety   . Varicose veins    Past Surgical History:  Procedure Laterality Date  . CHOLECYSTECTOMY     Social History   Tobacco Use  . Smoking status: Former Games developer  . Smokeless tobacco: Never Used  Substance Use Topics  . Alcohol use: Yes    Comment: Occ   family history includes Thyroid disease in her mother.  Medications: Current Outpatient Medications  Medication Sig Dispense Refill  . cetirizine (ZYRTEC) 10 MG tablet Take 10 mg by mouth daily.    . ferrous sulfate 325 (65 FE) MG tablet Take 325 mg  by mouth 3 (three) times daily with meals.    . fluticasone (FLONASE) 50 MCG/ACT nasal spray Place 2 sprays into both nostrils daily.    Marland Kitchen. glucosamine-chondroitin 500-400 MG tablet Take 1 tablet by mouth 3 (three) times daily.    Marland Kitchen. venlafaxine XR (EFFEXOR-XR) 150 MG 24 hr capsule Take 1 capsule (150 mg total) by mouth daily. 90 capsule 0   No current facility-administered medications for this visit.    No Known Allergies   Discussed warning signs or symptoms. Please see discharge instructions. Patient expresses understanding.

## 2018-02-26 NOTE — Patient Instructions (Addendum)
Thank you for coming in today. Continue current treatment.  Protect your skin.   Recheck in 1 year for well adult visit.

## 2018-04-18 ENCOUNTER — Encounter: Payer: Self-pay | Admitting: Family Medicine

## 2018-04-19 MED ORDER — DOXYCYCLINE HYCLATE 100 MG PO TABS: 50.0000 mg | ORAL_TABLET | Freq: Two times a day (BID) | ORAL | 0 refills | Status: DC

## 2019-02-22 ENCOUNTER — Other Ambulatory Visit: Payer: Self-pay | Admitting: Family Medicine

## 2019-02-22 DIAGNOSIS — F411 Generalized anxiety disorder: Secondary | ICD-10-CM

## 2020-01-25 ENCOUNTER — Other Ambulatory Visit: Payer: Self-pay | Admitting: Family Medicine

## 2020-01-25 DIAGNOSIS — F411 Generalized anxiety disorder: Secondary | ICD-10-CM

## 2020-07-15 ENCOUNTER — Ambulatory Visit (INDEPENDENT_AMBULATORY_CARE_PROVIDER_SITE_OTHER): Payer: Managed Care, Other (non HMO) | Admitting: Family Medicine

## 2020-07-15 ENCOUNTER — Encounter: Payer: Self-pay | Admitting: Family Medicine

## 2020-07-15 ENCOUNTER — Other Ambulatory Visit: Payer: Self-pay

## 2020-07-15 VITALS — BP 119/65 | HR 63 | Temp 98.5°F | Ht 64.76 in | Wt 128.5 lb

## 2020-07-15 DIAGNOSIS — Z0000 Encounter for general adult medical examination without abnormal findings: Secondary | ICD-10-CM | POA: Diagnosis not present

## 2020-07-15 DIAGNOSIS — D509 Iron deficiency anemia, unspecified: Secondary | ICD-10-CM

## 2020-07-15 DIAGNOSIS — F411 Generalized anxiety disorder: Secondary | ICD-10-CM | POA: Diagnosis not present

## 2020-07-15 DIAGNOSIS — Z13220 Encounter for screening for lipoid disorders: Secondary | ICD-10-CM

## 2020-07-15 MED ORDER — ESCITALOPRAM OXALATE 10 MG PO TABS: ORAL_TABLET | ORAL | 2 refills | Status: DC

## 2020-07-15 NOTE — Assessment & Plan Note (Signed)
Options discussed with her.  Will try lexapro but I did warn her of potential for sexual side effects.  We'll plan to follow up in about 6 weeks to reassess.   If she is not tolerating or not providing improvement we can consider trying bupropion or non-ssri like buspar.

## 2020-07-15 NOTE — Assessment & Plan Note (Signed)
Well adult Orders Placed This Encounter  Procedures   COMPLETE METABOLIC PANEL WITH GFR   CBC   Lipid Panel w/reflex Direct LDL   TSH   Fe+TIBC+Fer  Screenings: Lipid Immunizations:  She plans to receive flu vaccine soon.  Anticipatory guidance/Risk factor reduction:  Recommendations per AVS.

## 2020-07-15 NOTE — Progress Notes (Signed)
Krystal Hunter - 39 y.o. female MRN [id number]  Date of birth: 1981-03-23  Subjective Chief Complaint  Patient presents with  . Anxiety  . Establish Care    HPI Krystal Hunter is a 39 y.o. female here today for annual exam.  She has been in fairly good health and has history of anxiety.  She has tried a couple of medications for her anxiety throughout the years.  She most recently tried Effexor but found that this numbed her emotionally.  She had sexual side effects with sertraline previously.   She is a non-smoker and occasionally will have a serving of EtOH.    She does exercise frequently and feels like her diet is pretty healthy.   She does plan on getting flu shot, but doesn't want this today.  She has had COVID vaccine.   Review of Systems  Constitutional: Negative for chills, fever, malaise/fatigue and weight loss.  HENT: Negative for congestion, ear pain and sore throat.   Eyes: Negative for blurred vision, double vision and pain.  Respiratory: Negative for cough and shortness of breath.   Cardiovascular: Negative for chest pain and palpitations.  Gastrointestinal: Negative for abdominal pain, blood in stool, constipation, heartburn and nausea.  Genitourinary: Negative for dysuria and urgency.  Musculoskeletal: Negative for joint pain and myalgias.  Neurological: Negative for dizziness and headaches.  Endo/Heme/Allergies: Does not bruise/bleed easily.  Psychiatric/Behavioral: Negative for depression. The patient is nervous/anxious. The patient does not have insomnia.     No Known Allergies  Past Medical History:  Diagnosis Date  . Anxiety   . Varicose veins     Past Surgical History:  Procedure Laterality Date  . CHOLECYSTECTOMY      Social History   Socioeconomic History  . Marital status: Married    Spouse name: Not on file  . Number of children: Not on file  . Years of education: Not on file  . Highest education level: Not on file  Occupational  History  . Not on file  Tobacco Use  . Smoking status: Former Games developer  . Smokeless tobacco: Never Used  Substance and Sexual Activity  . Alcohol use: Yes    Comment: Occ  . Drug use: No  . Sexual activity: Yes    Comment: Husband has vasectomy  Other Topics Concern  . Not on file  Social History Narrative  . Not on file   Social Determinants of Health   Financial Resource Strain:   . Difficulty of Paying Living Expenses: Not on file  Food Insecurity:   . Worried About Programme researcher, broadcasting/film/video in the Last Year: Not on file  . Ran Out of Food in the Last Year: Not on file  Transportation Needs:   . Lack of Transportation (Medical): Not on file  . Lack of Transportation (Non-Medical): Not on file  Physical Activity:   . Days of Exercise per Week: Not on file  . Minutes of Exercise per Session: Not on file  Stress:   . Feeling of Stress : Not on file  Social Connections:   . Frequency of Communication with Friends and Family: Not on file  . Frequency of Social Gatherings with Friends and Family: Not on file  . Attends Religious Services: Not on file  . Active Member of Clubs or Organizations: Not on file  . Attends Banker Meetings: Not on file  . Marital Status: Not on file    Family History  Problem Relation Age of Onset  .  Thyroid disease Mother     Health Maintenance  Topic Date Due  . Hepatitis C Screening  Never done  . PAP SMEAR-Modifier  06/01/2019  . INFLUENZA VACCINE  11/25/2020 (Originally 03/28/2020)  . TETANUS/TDAP  05/11/2026  . COVID-19 Vaccine  Completed  . HIV Screening  Completed     ----------------------------------------------------------------------------------------------------------------------------------------------------------------------------------------------------------------- Physical Exam BP 119/65 (BP Location: Left Arm, Patient Position: Sitting, Cuff Size: Small)   Pulse 63   Temp 98.5 F (36.9 C)   Ht 5' 4.76" (1.645  m)   Wt 128 lb 8 oz (58.3 kg)   SpO2 100%   BMI 21.54 kg/m   Physical Exam Constitutional:      General: She is not in acute distress. HENT:     Head: Normocephalic and atraumatic.     Right Ear: Tympanic membrane normal.     Left Ear: Tympanic membrane normal.     Nose: Nose normal.  Eyes:     General: No scleral icterus.    Conjunctiva/sclera: Conjunctivae normal.  Neck:     Thyroid: No thyromegaly.  Cardiovascular:     Rate and Rhythm: Normal rate and regular rhythm.     Heart sounds: Normal heart sounds.  Pulmonary:     Effort: Pulmonary effort is normal.     Breath sounds: Normal breath sounds.  Abdominal:     General: Bowel sounds are normal. There is no distension.     Palpations: Abdomen is soft.     Tenderness: There is no abdominal tenderness. There is no guarding.  Musculoskeletal:        General: Normal range of motion.     Cervical back: Normal range of motion and neck supple.  Lymphadenopathy:     Cervical: No cervical adenopathy.  Skin:    General: Skin is warm and dry.     Findings: No rash.  Neurological:     Mental Status: She is alert and oriented to person, place, and time.     Cranial Nerves: No cranial nerve deficit.     Coordination: Coordination normal.  Psychiatric:        Behavior: Behavior normal.     ------------------------------------------------------------------------------------------------------------------------------------------------------------------------------------------------------------------- Assessment and Plan  Well adult exam Well adult Orders Placed This Encounter  Procedures  . COMPLETE METABOLIC PANEL WITH GFR  . CBC  . Lipid Panel w/reflex Direct LDL  . TSH  . Fe+TIBC+Fer  Screenings: Lipid Immunizations:  She plans to receive flu vaccine soon.  Anticipatory guidance/Risk factor reduction:  Recommendations per AVS.   GAD (generalized anxiety disorder) Options discussed with her.  Will try lexapro but I  did warn her of potential for sexual side effects.  We'll plan to follow up in about 6 weeks to reassess.   If she is not tolerating or not providing improvement we can consider trying bupropion or non-ssri like buspar.    Anemia, iron deficiency Update CBC and iron panel today.    Meds ordered this encounter  Medications  . escitalopram (LEXAPRO) 10 MG tablet    Sig: Take 1/2 tab x1 week then increase to full tab    Dispense:  30 tablet    Refill:  2    Return in about 6 weeks (around 08/26/2020) for anxiety.    This visit occurred during the SARS-CoV-2 public health emergency.  Safety protocols were in place, including screening questions prior to the visit, additional usage of staff PPE, and extensive cleaning of exam room while observing appropriate contact time as indicated for disinfecting  solutions.

## 2020-07-15 NOTE — Patient Instructions (Signed)

## 2020-07-15 NOTE — Assessment & Plan Note (Signed)
Update CBC and iron panel today.

## 2020-07-16 LAB — CBC
HCT: 37.3 % (ref 35.0–45.0)
Hemoglobin: 12.7 g/dL (ref 11.7–15.5)
MCH: 32 pg (ref 27.0–33.0)
MCHC: 34 g/dL (ref 32.0–36.0)
MCV: 94 fL (ref 80.0–100.0)
MPV: 10.2 fL (ref 7.5–12.5)
Platelets: 355 10*3/uL (ref 140–400)
RBC: 3.97 10*6/uL (ref 3.80–5.10)
RDW: 11.8 % (ref 11.0–15.0)
WBC: 4.5 10*3/uL (ref 3.8–10.8)

## 2020-07-16 LAB — COMPLETE METABOLIC PANEL WITH GFR
AG Ratio: 2.1 (calc) (ref 1.0–2.5)
ALT: 21 U/L (ref 6–29)
AST: 26 U/L (ref 10–30)
Albumin: 4.5 g/dL (ref 3.6–5.1)
Alkaline phosphatase (APISO): 57 U/L (ref 31–125)
BUN: 10 mg/dL (ref 7–25)
CO2: 27 mmol/L (ref 20–32)
Calcium: 9.2 mg/dL (ref 8.6–10.2)
Chloride: 105 mmol/L (ref 98–110)
Creat: 1.03 mg/dL (ref 0.50–1.10)
GFR, Est African American: 79 mL/min/{1.73_m2} (ref >=60)
GFR, Est Non African American: 68 mL/min/{1.73_m2} (ref >=60)
Globulin: 2.1 g/dL (calc) (ref 1.9–3.7)
Glucose, Bld: 75 mg/dL (ref 65–139)
Potassium: 4.3 mmol/L (ref 3.5–5.3)
Sodium: 140 mmol/L (ref 135–146)
Total Bilirubin: 0.4 mg/dL (ref 0.2–1.2)
Total Protein: 6.6 g/dL (ref 6.1–8.1)

## 2020-07-16 LAB — IRON,TIBC AND FERRITIN PANEL
%SAT: 13 % (calc) — ABNORMAL LOW (ref 16–45)
Ferritin: 39 ng/mL (ref 16–154)
Iron: 51 ug/dL (ref 40–190)
TIBC: 389 mcg/dL (calc) (ref 250–450)

## 2020-07-16 LAB — LIPID PANEL W/REFLEX DIRECT LDL
Cholesterol: 225 mg/dL — ABNORMAL HIGH (ref <200)
HDL: 106 mg/dL (ref >=50)
LDL Cholesterol (Calc): 93 mg/dL (calc)
Non-HDL Cholesterol (Calc): 119 mg/dL (calc) (ref <130)
Total CHOL/HDL Ratio: 2.1 (calc) (ref <5.0)
Triglycerides: 163 mg/dL — ABNORMAL HIGH (ref <150)

## 2020-07-16 LAB — TSH: TSH: 1.29 mIU/L

## 2020-08-26 ENCOUNTER — Encounter: Payer: Self-pay | Admitting: Family Medicine

## 2020-08-26 ENCOUNTER — Telehealth (INDEPENDENT_AMBULATORY_CARE_PROVIDER_SITE_OTHER): Payer: Managed Care, Other (non HMO) | Admitting: Family Medicine

## 2020-08-26 DIAGNOSIS — F418 Other specified anxiety disorders: Secondary | ICD-10-CM

## 2020-08-26 MED ORDER — BUPROPION HCL ER (XL) 150 MG PO TB24: 150.0000 mg | ORAL_TABLET | Freq: Every day | ORAL | 1 refills | Status: DC

## 2020-08-26 NOTE — Assessment & Plan Note (Signed)
Tried effexor, lexapro and sertraline.  Discussed alternatives.  Will try bupropion.  She will let me know if she has worsening anxiety with this.   I'll plan to follow up with her in about 4 weeks.  If bupropion is not effective we can consider change to Trintellix or Viibryd.

## 2020-08-26 NOTE — Progress Notes (Signed)
Krystal Hunter - 39 y.o. female MRN [id number]  Date of birth: [birth year]   This visit type was conducted due to national recommendations for restrictions regarding the COVID-19 Pandemic (e.g. social distancing).  This format is felt to be most appropriate for this patient at this time.  All issues noted in this document were discussed and addressed.  No physical exam was performed (except for noted visual exam findings with Video Visits).  I discussed the limitations of evaluation and management by telemedicine and the availability of in person appointments. The patient expressed understanding and agreed to proceed.  I connected with@ on 08/26/20 at  1:20 PM EST by a video enabled telemedicine application and verified that I am speaking with the correct person using two identifiers.  Present at visit: Everrett Coombe, DO [facility]   Patient Location: Home [address] [name] [address]   Provider location:   [facility]  Chief Complaint  Patient presents with  . Anxiety    HPI  Krystal Hunter is a 39 y.o. female who presents via Web designer for a telehealth visit today.  She is following up today for anxiety and depression.  She was changed from effexor to lexapro at her last visit as she felt that this was not effective for her.  Had tried sertraline previously with sexual side effects.  Since starting lexapro she reports that she feels more fatigued and sluggish.  She has tried changing the time of day she takes this without much improvement.  She would like to try something different.   She feels like her depression and low energy levels are more of a problem than her anxiety at this time.   Depression screen [facility] 2/9 08/26/2020 07/15/2020 02/26/2018  Decreased Interest 1 3 0  Down, Depressed, Hopeless 1 3 0  PHQ - 2 Score 2 6 0  Altered sleeping 0 0 0  Tired, decreased energy 3 2 0  Change in appetite 0 0 0  Feeling bad or failure about yourself  1 3 0   Trouble concentrating 1 2 0  Moving slowly or fidgety/restless 0 1 0  Suicidal thoughts 0 0 0  PHQ-9 Score 7 14 0  Difficult doing work/chores Somewhat difficult Somewhat difficult Not difficult at all   GAD 7 : Generalized Anxiety Score 08/26/2020 07/15/2020 02/26/2018 09/04/2017  Nervous, Anxious, on Edge 2 3 0 1  Control/stop worrying 2 3 0 0  Worry too much - different things 1 3 0 2  Trouble relaxing 2 3 1 1   Restless 0 2 1 1   Easily annoyed or irritable 0 3 1 1   Afraid - awful might happen 0 2 0 1  Total GAD 7 Score 7 19 3 7   Anxiety Difficulty Not difficult at all Somewhat difficult Not difficult at all Somewhat difficult     ROS:  A comprehensive ROS was completed and negative except as noted per HPI  Past Medical History:  Diagnosis Date  . Anxiety   . Varicose veins     Past Surgical History:  Procedure Laterality Date  . CHOLECYSTECTOMY      Family History  Problem Relation Age of Onset  . Thyroid disease Mother     Social History   Socioeconomic History  . Marital status: Married    Spouse name: Not on file  . Number of children: Not on file  . Years of education: Not on file  . Highest education level: Not on file  Occupational History  .  Not on file  Tobacco Use  . Smoking status: Former Games developer  . Smokeless tobacco: Never Used  Substance and Sexual Activity  . Alcohol use: Yes    Comment: Occ  . Drug use: No  . Sexual activity: Yes    Comment: Husband has vasectomy  Other Topics Concern  . Not on file  Social History Narrative  . Not on file   Social Determinants of Health   Financial Resource Strain: Not on file  Food Insecurity: Not on file  Transportation Needs: Not on file  Physical Activity: Not on file  Stress: Not on file  Social Connections: Not on file  Intimate Partner Violence: Not on file     Current Outpatient Medications:  .  Chaste Tree (VITEX EXTRACT PO), Take 1,000 mg by mouth., Disp: , Rfl:  .  clindamycin  (CLEOCIN T) 1 % lotion, Apply topically 2 (two) times daily., Disp: , Rfl:  .  escitalopram (LEXAPRO) 10 MG tablet, Take 1/2 tab x1 week then increase to full tab, Disp: 30 tablet, Rfl: 2 .  ferrous sulfate 325 (65 FE) MG tablet, Take 325 mg by mouth daily with breakfast. , Disp: , Rfl:  .  glucosamine-chondroitin 500-400 MG tablet, Take 1 tablet by mouth 3 (three) times daily., Disp: , Rfl:  .  HYDROGEN PEROXIDE EX, Apply topically., Disp: , Rfl:  .  spironolactone (ALDACTONE) 25 MG tablet, Take 25 mg by mouth daily., Disp: , Rfl:  .  tretinoin (RETIN-A) 0.1 % cream, Apply topically at bedtime., Disp: , Rfl:   EXAM:  VITALS per patient if applicable: Wt 125 lb (56.7 kg)   BMI 20.95 kg/m   GENERAL: alert, oriented, appears well and in no acute distress  HEENT: atraumatic, conjunttiva clear, no obvious abnormalities on inspection of external nose and ears  NECK: normal movements of the head and neck  LUNGS: on inspection no signs of respiratory distress, breathing rate appears normal, no obvious gross SOB, gasping or wheezing  CV: no obvious cyanosis  MS: moves all visible extremities without noticeable abnormality  PSYCH/NEURO: pleasant and cooperative, no obvious depression or anxiety, speech and thought processing grossly intact  ASSESSMENT AND PLAN:  Discussed the following assessment and plan:  Depression with anxiety Tried effexor, lexapro and sertraline.  Discussed alternatives.  Will try bupropion.  She will let me know if she has worsening anxiety with this.   I'll plan to follow up with her in about 4 weeks.  If bupropion is not effective we can consider change to Trintellix or Viibryd.      I discussed the assessment and treatment plan with the patient. The patient was provided an opportunity to ask questions and all were answered. The patient agreed with the plan and demonstrated an understanding of the instructions.   The patient was advised to call back or seek  an in-person evaluation if the symptoms worsen or if the condition fails to improve as anticipated.    Everrett Coombe, DO

## 2020-10-05 ENCOUNTER — Other Ambulatory Visit: Payer: Self-pay

## 2020-10-05 MED ORDER — BUPROPION HCL ER (XL) 150 MG PO TB24
150.0000 mg | ORAL_TABLET | Freq: Every day | ORAL | 2 refills | Status: DC
Start: 2020-10-05 — End: 2020-10-19

## 2020-10-19 ENCOUNTER — Encounter: Payer: Self-pay | Admitting: Family Medicine

## 2020-10-19 MED ORDER — BUPROPION HCL ER (XL) 300 MG PO TB24
300.0000 mg | ORAL_TABLET | Freq: Every day | ORAL | 3 refills | Status: DC
Start: 2020-10-19 — End: 2020-11-15

## 2020-11-15 ENCOUNTER — Other Ambulatory Visit: Payer: Self-pay

## 2020-11-15 MED ORDER — BUPROPION HCL ER (XL) 300 MG PO TB24
300.0000 mg | ORAL_TABLET | Freq: Every day | ORAL | 3 refills | Status: DC
Start: 2020-11-15 — End: 2021-01-12

## 2020-11-16 ENCOUNTER — Other Ambulatory Visit: Payer: Self-pay | Admitting: Family Medicine

## 2020-11-16 MED ORDER — ESCITALOPRAM OXALATE 10 MG PO TABS: ORAL_TABLET | ORAL | 2 refills | Status: DC

## 2020-12-14 ENCOUNTER — Other Ambulatory Visit: Payer: Self-pay

## 2020-12-14 DIAGNOSIS — F411 Generalized anxiety disorder: Secondary | ICD-10-CM

## 2020-12-14 DIAGNOSIS — F418 Other specified anxiety disorders: Secondary | ICD-10-CM

## 2020-12-14 MED ORDER — ESCITALOPRAM OXALATE 10 MG PO TABS: 10.0000 mg | ORAL_TABLET | Freq: Every day | ORAL | 2 refills | Status: DC

## 2021-01-12 ENCOUNTER — Other Ambulatory Visit: Payer: Self-pay

## 2021-01-12 MED ORDER — BUPROPION HCL ER (XL) 150 MG PO TB24: 150.0000 mg | ORAL_TABLET | Freq: Every day | ORAL | 0 refills | Status: DC

## 2021-01-12 MED ORDER — BUPROPION HCL ER (XL) 150 MG PO TB24
150.0000 mg | ORAL_TABLET | Freq: Every day | ORAL | 0 refills | Status: DC
Start: 2021-01-12 — End: 2021-03-31

## 2021-01-25 ENCOUNTER — Encounter: Payer: Self-pay | Admitting: Family Medicine

## 2021-01-31 ENCOUNTER — Encounter: Payer: Self-pay | Admitting: Family Medicine

## 2021-01-31 ENCOUNTER — Telehealth (INDEPENDENT_AMBULATORY_CARE_PROVIDER_SITE_OTHER): Payer: Managed Care, Other (non HMO) | Admitting: Family Medicine

## 2021-01-31 VITALS — Ht 64.0 in | Wt 125.0 lb

## 2021-01-31 DIAGNOSIS — R41840 Attention and concentration deficit: Secondary | ICD-10-CM

## 2021-01-31 DIAGNOSIS — F418 Other specified anxiety disorders: Secondary | ICD-10-CM | POA: Diagnosis not present

## 2021-01-31 MED ORDER — VORTIOXETINE HBR 10 MG PO TABS: 10.0000 mg | ORAL_TABLET | Freq: Every day | ORAL | 3 refills | Status: DC

## 2021-01-31 NOTE — Progress Notes (Signed)
Wants to discuss MyChart medication suggestion.

## 2021-02-02 ENCOUNTER — Telehealth: Payer: Self-pay

## 2021-02-02 NOTE — Assessment & Plan Note (Signed)
Discussed alternative of trintellix.  Will wean from lexapro and bupropion. Starting trintellix if approved.   Referral placed for adult  ADD evaluation as well.

## 2021-02-02 NOTE — Telephone Encounter (Signed)
Message sent to Dr. Ashley Royalty for visit not completion

## 2021-02-02 NOTE — Progress Notes (Signed)
Krystal Hunter - 40 y.o. female MRN [id number]  Date of birth: 1980-11-03   This visit type was conducted due to national recommendations for restrictions regarding the COVID-19 Pandemic (e.g. social distancing).  This format is felt to be most appropriate for this patient at this time.  All issues noted in this document were discussed and addressed.  No physical exam was performed (except for noted visual exam findings with Video Visits).  I discussed the limitations of evaluation and management by telemedicine and the availability of in person appointments. The patient expressed understanding and agreed to proceed.  I connected with@ on 02/02/21 at  1:50 PM EDT by a video enabled telemedicine application and verified that I am speaking with the correct person using two identifiers.  Present at visit: Everrett Coombe, DO [name]   Patient Location: Home [address] [name] [address]   Provider location:   [facility]  No chief complaint on file.   HPI  Krystal Hunter is a 40 y.o. female who presents via Web designer for a telehealth visit today.  She is following up for depression and anxiety.  She is currently treated with combination of lexapro and bupropion.  She had done well initially with this however feels that the effects have worn off.  She has had similar experience when taking similar medications.  She thinks there may be a component of ADD that is contributing to these symptoms as well.  She would be open to trying new medication while awaiting ADHD referral.    ROS:  A comprehensive ROS was completed and negative except as noted per HPI      Past Medical History:  Diagnosis Date  . Anxiety   . Varicose veins     Past Surgical History:  Procedure Laterality Date  . CHOLECYSTECTOMY      Family History  Problem Relation Age of Onset  . Thyroid disease Mother     Social History   Socioeconomic History  . Marital status: Married     Spouse name: Not on file  . Number of children: Not on file  . Years of education: Not on file  . Highest education level: Not on file  Occupational History  . Not on file  Tobacco Use  . Smoking status: Former Games developer  . Smokeless tobacco: Never Used  Substance and Sexual Activity  . Alcohol use: Yes    Comment: Occ  . Drug use: No  . Sexual activity: Yes    Comment: Husband has vasectomy  Other Topics Concern  . Not on file  Social History Narrative  . Not on file   Social Determinants of Health   Financial Resource Strain: Not on file  Food Insecurity: Not on file  Transportation Needs: Not on file  Physical Activity: Not on file  Stress: Not on file  Social Connections: Not on file  Intimate Partner Violence: Not on file     Current Outpatient Medications:  .  buPROPion (WELLBUTRIN XL) 150 MG 24 hr tablet, Take 1 tablet (150 mg total) by mouth daily., Disp: 90 tablet, Rfl: 0 .  Chaste Tree (VITEX EXTRACT PO), Take 1,000 mg by mouth., Disp: , Rfl:  .  escitalopram (LEXAPRO) 10 MG tablet, Take 1 tablet (10 mg total) by mouth daily., Disp: 90 tablet, Rfl: 2 .  ferrous sulfate 325 (65 FE) MG tablet, Take 325 mg by mouth daily with breakfast. , Disp: , Rfl:  .  glucosamine-chondroitin 500-400 MG tablet, Take 1 tablet  by mouth 3 (three) times daily., Disp: , Rfl:  .  vortioxetine HBr (TRINTELLIX) 10 MG TABS tablet, Take 1 tablet (10 mg total) by mouth daily., Disp: 30 tablet, Rfl: 3  EXAM:  VITALS per patient if applicable: Ht 5\' 4"  (1.626 m)   Wt 125 lb (56.7 kg)   BMI 21.46 kg/m   GENERAL: alert, oriented, appears well and in no acute distress  HEENT: atraumatic, conjunttiva clear, no obvious abnormalities on inspection of external nose and ears  NECK: normal movements of the head and neck  LUNGS: on inspection no signs of respiratory distress, breathing rate appears normal, no obvious gross SOB, gasping or wheezing  CV: no obvious cyanosis  MS: moves all  visible extremities without noticeable abnormality  PSYCH/NEURO: pleasant and cooperative, no obvious depression or anxiety, speech and thought processing grossly intact  ASSESSMENT AND PLAN:  Discussed the following assessment and plan:  Depression with anxiety Discussed alternative of trintellix.  Will wean from lexapro and bupropion. Starting trintellix if approved.   Referral placed for adult  ADD evaluation as well.      I discussed the assessment and treatment plan with the patient. The patient was provided an opportunity to ask questions and all were answered. The patient agreed with the plan and demonstrated an understanding of the instructions.   The patient was advised to call back or seek an in-person evaluation if the symptoms worsen or if the condition fails to improve as anticipated.    , DO

## 2021-02-03 ENCOUNTER — Encounter: Payer: Self-pay | Admitting: Family Medicine

## 2021-02-04 ENCOUNTER — Ambulatory Visit (INDEPENDENT_AMBULATORY_CARE_PROVIDER_SITE_OTHER): Payer: Managed Care, Other (non HMO)

## 2021-02-04 ENCOUNTER — Ambulatory Visit (INDEPENDENT_AMBULATORY_CARE_PROVIDER_SITE_OTHER): Payer: Managed Care, Other (non HMO) | Admitting: Sports Medicine

## 2021-02-04 ENCOUNTER — Other Ambulatory Visit: Payer: Self-pay

## 2021-02-04 DIAGNOSIS — M25562 Pain in left knee: Secondary | ICD-10-CM | POA: Diagnosis not present

## 2021-02-04 DIAGNOSIS — Z09 Encounter for follow-up examination after completed treatment for conditions other than malignant neoplasm: Secondary | ICD-10-CM | POA: Diagnosis not present

## 2021-02-04 MED ORDER — MELOXICAM 15 MG PO TABS: ORAL_TABLET | ORAL | 3 refills | Status: AC

## 2021-02-04 NOTE — Assessment & Plan Note (Signed)
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 Ultimately it improved and today she is feeling significantly better. On exam she only has a minimal amount of effusion, there really is not any discrete areas of tenderness, but I am able to reproduce her pain with the application of valgus stress and she does have a positive McMurray's sign with pain but no palpable pops. No patellar crepitus. Considering the location of her pain my differential does include a medial meniscal tear and patellofemoral chondromalacia. We discussed the treatment protocol, we will start conservatively with meloxicam, x-rays, home rehab exercises, return to see me in a month, MRI if no better.

## 2021-02-04 NOTE — Progress Notes (Signed)
    Procedures performed today:    None.  Independent interpretation of notes and tests performed by another provider:   None.  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 Ultimately it improved and today she is feeling significantly better. On exam she only has a minimal amount of effusion, there really is not any discrete areas of tenderness, but I am able to reproduce her pain with the application of valgus stress and she does have a positive McMurray's sign with pain but no palpable pops. No patellar crepitus. Considering the location of her pain my differential does include a medial meniscal tear and patellofemoral chondromalacia. We discussed the treatment protocol, we will start conservatively with meloxicam, x-rays, home rehab exercises, return to see me in a month, MRI if no better.    ___________________________________________ Ihor Austin. Benjamin Stain, M.D., ABFM., CAQSM. Primary Care and Sports Medicine [facility]  Adjunct Instructor of Family Medicine  [employer]

## 2021-03-07 ENCOUNTER — Ambulatory Visit: Payer: Managed Care, Other (non HMO) | Admitting: Sports Medicine

## 2021-03-15 ENCOUNTER — Ambulatory Visit (INDEPENDENT_AMBULATORY_CARE_PROVIDER_SITE_OTHER): Payer: Managed Care, Other (non HMO) | Admitting: Sports Medicine

## 2021-03-15 ENCOUNTER — Other Ambulatory Visit: Payer: Self-pay

## 2021-03-15 DIAGNOSIS — M25562 Pain in left knee: Secondary | ICD-10-CM

## 2021-03-15 MED ORDER — TRIAZOLAM 0.25 MG PO TABS: ORAL_TABLET | ORAL | 0 refills | Status: DC

## 2021-03-15 NOTE — Progress Notes (Signed)
    Procedures performed today:    None.  Independent interpretation of notes and tests performed by another provider:   None.  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 Ultimately went running and felt a pop and a snap medial aspect of the anterior knee and pain and inability to bear weight.  Improved to some degree, however continues to have significant discomfort in spite of conservative treatment including greater than 6 weeks of physician directed meniscal tear conditioning exercises, meloxicam, x-rays did show some degenerative processes, on exam she does have a McMurray's sign, and pain with application of valgus stress. I suspect anterior horn medial meniscal tear, MRI will be obtained for procedural planning. Return to go over MRI results, which will likely include an injection. Triazolam for preprocedural anxiolysis.    ___________________________________________ Ihor Krystal Hunter, M.D., ABFM., CAQSM. Primary Care and Sports Medicine [facility]  Adjunct Instructor of Family Medicine  [employer]

## 2021-03-15 NOTE — Assessment & Plan Note (Addendum)
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 Ultimately went running and felt a pop and a snap medial aspect of the anterior knee and pain and inability to bear weight.  Improved to some degree, however continues to have significant discomfort in spite of conservative treatment including greater than 6 weeks of physician directed meniscal tear conditioning exercises, meloxicam, x-rays did show some degenerative processes, on exam she does have a McMurray's sign, and pain with application of valgus stress. I suspect anterior horn medial meniscal tear, MRI will be obtained for procedural planning. Return to go over MRI results, which will likely include an injection. Triazolam for preprocedural anxiolysis.

## 2021-03-26 ENCOUNTER — Other Ambulatory Visit: Payer: Self-pay

## 2021-03-26 ENCOUNTER — Ambulatory Visit (INDEPENDENT_AMBULATORY_CARE_PROVIDER_SITE_OTHER): Payer: Managed Care, Other (non HMO)

## 2021-03-26 DIAGNOSIS — M25562 Pain in left knee: Secondary | ICD-10-CM

## 2021-03-30 ENCOUNTER — Ambulatory Visit (INDEPENDENT_AMBULATORY_CARE_PROVIDER_SITE_OTHER): Payer: Managed Care, Other (non HMO) | Admitting: Sports Medicine

## 2021-03-30 ENCOUNTER — Ambulatory Visit (INDEPENDENT_AMBULATORY_CARE_PROVIDER_SITE_OTHER): Payer: Managed Care, Other (non HMO)

## 2021-03-30 DIAGNOSIS — M25562 Pain in left knee: Secondary | ICD-10-CM

## 2021-03-30 NOTE — Progress Notes (Signed)
    Procedures performed today:    Procedure: Real-time Ultrasound Guided injection of the left knee Device: Samsung HS60  Verbal informed consent obtained.  Time-out conducted.  Noted no overlying erythema, induration, or other signs of local infection.  Skin prepped in a sterile fashion.  Local anesthesia: Topical Ethyl chloride.  With sterile technique and under real time ultrasound guidance: Noted trace effusion, 1 cc Kenalog 40, 2 cc lidocaine, 2 cc bupivacaine injected easily Completed without difficulty  Advised to call if fevers/chills, erythema, induration, drainage, or persistent bleeding.  Images permanently stored and available for review in PACS.  Impression: Technically successful ultrasound guided injection.  Independent interpretation of notes and tests performed by another provider:   None.  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 We treated her conservatively, meloxicam, rehab exercises, she did not improve so we obtained an MRI that showed medial meniscal fraying as well as some osteoarthritis. Due to the lack of significant mechanical symptoms we are going to do an injection today, if this fails we will do viscosupplementation, if that fails I would refer her for arthroscopy. Return to see me in 1 month, she understands to take it easy for 4 days after the injection.   ___________________________________________ Ihor Austin. Benjamin Stain, M.D., ABFM., CAQSM. Primary Care and Sports Medicine [facility] [facility]  Adjunct Instructor of Family Medicine  [employer]

## 2021-03-30 NOTE — Assessment & Plan Note (Signed)
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 We treated her conservatively, meloxicam, rehab exercises, she did not improve so we obtained an MRI that showed medial meniscal fraying as well as some osteoarthritis. Due to the lack of significant mechanical symptoms we are going to do an injection today, if this fails we will do viscosupplementation, if that fails I would refer her for arthroscopy. Return to see me in 1 month, she understands to take it easy for 4 days after the injection.

## 2021-03-31 ENCOUNTER — Other Ambulatory Visit: Payer: Self-pay | Admitting: Family Medicine

## 2021-03-31 ENCOUNTER — Encounter: Payer: Self-pay | Admitting: Family Medicine

## 2021-03-31 MED ORDER — LISDEXAMFETAMINE DIMESYLATE 30 MG PO CAPS: 30.0000 mg | ORAL_CAPSULE | Freq: Every day | ORAL | 0 refills | Status: DC

## 2021-04-27 ENCOUNTER — Other Ambulatory Visit: Payer: Self-pay

## 2021-04-27 ENCOUNTER — Ambulatory Visit (INDEPENDENT_AMBULATORY_CARE_PROVIDER_SITE_OTHER): Payer: Managed Care, Other (non HMO) | Admitting: Sports Medicine

## 2021-04-27 ENCOUNTER — Telehealth: Payer: Self-pay | Admitting: Sports Medicine

## 2021-04-27 DIAGNOSIS — M25562 Pain in left knee: Secondary | ICD-10-CM

## 2021-04-27 DIAGNOSIS — G8929 Other chronic pain: Secondary | ICD-10-CM

## 2021-04-27 NOTE — Assessment & Plan Note (Signed)
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 I have asked her to take it easy for the next couple weeks, we are going to get her approved for viscosupplementation.

## 2021-04-27 NOTE — Telephone Encounter (Signed)
Please work on [employer], any brand. Orthovisc preferred.  X-ray confirmed osteoarthritis, failed everything.

## 2021-04-27 NOTE — Progress Notes (Signed)
    Procedures performed today:    None.  Independent interpretation of notes and tests performed by another provider:   None.  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 I have asked her to take it easy for the next couple weeks, we are going to get her approved for viscosupplementation.    ___________________________________________ Ihor Austin. Benjamin Stain, M.D., ABFM., CAQSM. Primary Care and Sports Medicine [place] [facility]  Adjunct Instructor of Family Medicine  [employer]

## 2021-04-28 ENCOUNTER — Other Ambulatory Visit: Payer: Self-pay | Admitting: Family Medicine

## 2021-04-28 MED ORDER — LISDEXAMFETAMINE DIMESYLATE 30 MG PO CAPS: 30.0000 mg | ORAL_CAPSULE | Freq: Every day | ORAL | 0 refills | Status: DC

## 2021-05-19 MED ORDER — LISDEXAMFETAMINE DIMESYLATE 40 MG PO CAPS: 40.0000 mg | ORAL_CAPSULE | ORAL | 0 refills | Status: DC

## 2021-05-26 NOTE — Telephone Encounter (Signed)
PA information submitted via [employer] Paperwork has been printed and given to Dr. T for signatures. Once obtained, information will be faxed to [employer] at [phone]  

## 2021-06-01 NOTE — Telephone Encounter (Signed)
[employer] paperwork signed by Dr. Karie Schwalbe and faxed to [employer] at [phone] on 05/26/2021 Fax confirmation receipt received

## 2021-06-09 NOTE — Telephone Encounter (Signed)
Benefits Investigation Details received from [employer].  Medical: Deductible applies. Once the deductible has been met, patient is responsible for a coinsurance. Once the OOP has been met, patient is covered at 100%. PA is required for the drug through [place]. Beginning 02/25/2021, Christella Scheuermann will require extensive clinical records accompanying a PA unless a prior treatment within the last 365 days is on file for the drug. PA form faxed: [employer] at [phone].  Fax confirmation received  Pharmacy: [facility] [place]  May fill through: Both  OV Copay/Coinsurance: 15% Product Copay: 15% Administration Coinsurance: 15% Administration Copay: $0 Deductible: $6400 (met: $2952.08) Out of Pocket Max: $6400 (met: [id number])

## 2021-06-09 NOTE — Telephone Encounter (Deleted)
Voicemail box full (1st attempt) Will try to call patient back at a later time.   Pt will need to meet the deductible first and then insurance will pick up at 15%. She has met $2953.08 for her $6400 deductible.   Pt also aware via MyChart message.

## 2021-06-13 ENCOUNTER — Other Ambulatory Visit: Payer: Self-pay | Admitting: Family Medicine

## 2021-06-13 MED ORDER — LISDEXAMFETAMINE DIMESYLATE 40 MG PO CAPS: 40.0000 mg | ORAL_CAPSULE | ORAL | 0 refills | Status: DC

## 2021-06-15 ENCOUNTER — Other Ambulatory Visit: Payer: Self-pay

## 2021-06-15 NOTE — Telephone Encounter (Signed)
Pt has [employer] and they only approve Euflexxa. Completed PA on the phone with Crystal and she said that it is being sent for pharmacy review and that we should have a determination in 5 business days. Pt aware.    She will have to pay OOP to reach her deductible and then insurance will pick up 85% but they still have to have the PA ahead of time.

## 2021-06-21 ENCOUNTER — Ambulatory Visit: Payer: Managed Care, Other (non HMO) | Admitting: Sports Medicine

## 2021-06-22 MED ORDER — EUFLEXXA 20 MG/2ML IX SOSY: PREFILLED_SYRINGE | INTRA_ARTICULAR | 1 refills | Status: DC

## 2021-06-22 NOTE — Addendum Note (Signed)
Addended by: Monica Becton on: 06/22/2021 02:48 PM   Modules accepted: Orders

## 2021-06-22 NOTE — Telephone Encounter (Signed)
PA response received from [facility].  Medication: Euflexxa Prior authorization determination received Medication has been denied Reason for denial:  "Euflexxa is considered medically necessary when all of the following are met (1, 2, and 3): 1. Osteoarthritis of the knee; 2. Diagnosis of the knee to be treated is documented by radiologic evidence of osteoarthritis of the knee (for example, joint space narrowing, subchondral sclerosis, osteophytes, and sub-chondral cysts); and 3. Documentation of one of the following (A or B): A. Individual has had an inadequate response to two of the following modalities of therapy for osteoarthritis (I, ii, iii): I. At least six weeks of provider-directed conservative management program consisting of physical therapy or home exercises, II. At least two of the following pharmacologic therapies: oral or topical NSAID's, acetaminophen, tramadol, and duloxetine; and/or iii. At lease one injection of intraarticular corticosteroids to the affected knee; or B. Individual has a contraindication per FDA labeled, significant intolerance, or is not a candidate for all of the following modalities of therapy for osteoarthritis (I, ii, iii): provider-directed conservative management program consisting of physical therapy or home exercises; ii. Phramcologic therapies for knee osteoarthritis; and iii. Intra-articular corticosteroids. Intra-articular hyaluronic acid products are considered medically necessary for continued use when initial criteria are met and there is documentation of beneficial response, including the following: at lease 6 months have lapsed since the completion of the prior treatment course. Intra-articular hyaluronic acid products are considered experimental, investigational or unproven for any other use including the following: 1. Acute ankle sprain; 2. Osteoarthritis and other pathologic conditions involving joints; and 3. The combination of any other product, (for example,  PRP, stem cell products, amniotic products, or corticosteroids) with a viscosupplementation injection"

## 2021-06-22 NOTE — Telephone Encounter (Signed)
I am going to just send it to [facility] and lets see what they say.

## 2021-06-28 ENCOUNTER — Ambulatory Visit: Payer: Managed Care, Other (non HMO) | Admitting: Sports Medicine

## 2021-07-05 ENCOUNTER — Ambulatory Visit: Payer: Managed Care, Other (non HMO) | Admitting: Sports Medicine

## 2021-07-08 ENCOUNTER — Other Ambulatory Visit: Payer: Self-pay | Admitting: Family Medicine

## 2021-07-12 ENCOUNTER — Ambulatory Visit: Payer: Managed Care, Other (non HMO) | Admitting: Sports Medicine

## 2021-07-12 MED ORDER — LISDEXAMFETAMINE DIMESYLATE 40 MG PO CAPS: 40.0000 mg | ORAL_CAPSULE | ORAL | 0 refills | Status: DC

## 2021-08-05 ENCOUNTER — Other Ambulatory Visit: Payer: Self-pay | Admitting: Family Medicine

## 2021-08-05 MED ORDER — LISDEXAMFETAMINE DIMESYLATE 40 MG PO CAPS: 40.0000 mg | ORAL_CAPSULE | ORAL | 0 refills | Status: DC

## 2021-08-18 NOTE — Telephone Encounter (Signed)
Spoke with [facility] and they said that the Rx was denied, needs a PA. PA has been denied:   "Euflexxa is considered medically necessary when all of the following are met (1, 2, and 3): 1. Osteoarthritis of the knee; 2. Diagnosis of the knee to be treated is documented by radiologic evidence of osteoarthritis of the knee (for example, joint space narrowing, subchondral sclerosis, osteophytes, and sub-chondral cysts); and 3. Documentation of one of the following (A or B): A. Individual has had an inadequate response to two of the following modalities of therapy for osteoarthritis (I, ii, iii): I. At least six weeks of provider-directed conservative management program consisting of physical therapy or home exercises, II. At least two of the following pharmacologic therapies: oral or topical NSAID's, acetaminophen, tramadol, and duloxetine; and/or iii. At lease one injection of intraarticular corticosteroids to the affected knee; or B. Individual has a contraindication per FDA labeled, significant intolerance, or is not a candidate for all of the following modalities of therapy for osteoarthritis (I, ii, iii): provider-directed conservative management program consisting of physical therapy or home exercises; ii. Phramcologic therapies for knee osteoarthritis; and iii. Intra-articular corticosteroids. Intra-articular hyaluronic acid products are considered medically necessary for continued use when initial criteria are met and there is documentation of beneficial response, including the following: at lease 6 months have lapsed since the completion of the prior treatment course. Intra-articular hyaluronic acid products are considered experimental, investigational or unproven for any other use including the following: 1. Acute ankle sprain; 2. Osteoarthritis and other pathologic conditions involving joints; and 3. The combination of any other product, (for example, PRP, stem cell products, amniotic products, or  corticosteroids) with a viscosupplementation injection"

## 2021-08-19 NOTE — Telephone Encounter (Signed)
She has had all of that done already, either try to resubmit or let me do a peer to peer somehow.

## 2021-09-05 ENCOUNTER — Encounter: Payer: Self-pay | Admitting: Family Medicine

## 2021-09-07 ENCOUNTER — Telehealth (INDEPENDENT_AMBULATORY_CARE_PROVIDER_SITE_OTHER): Payer: Managed Care, Other (non HMO) | Admitting: Family Medicine

## 2021-09-07 ENCOUNTER — Encounter: Payer: Self-pay | Admitting: Family Medicine

## 2021-09-07 DIAGNOSIS — F988 Other specified behavioral and emotional disorders with onset usually occurring in childhood and adolescence: Secondary | ICD-10-CM

## 2021-09-07 MED ORDER — AMPHETAMINE-DEXTROAMPHETAMINE 10 MG PO TABS: ORAL_TABLET | ORAL | 0 refills | Status: DC

## 2021-09-07 MED ORDER — LISDEXAMFETAMINE DIMESYLATE 40 MG PO CAPS: 40.0000 mg | ORAL_CAPSULE | ORAL | 0 refills | Status: DC

## 2021-09-07 NOTE — Progress Notes (Signed)
Krystal Hunter - 41 y.o. female MRN [id number]  Date of birth: October 09, 1980   This visit type was conducted due to national recommendations for restrictions regarding the COVID-19 Pandemic (e.g. social distancing).  This format is felt to be most appropriate for this patient at this time.  All issues noted in this document were discussed and addressed.  No physical exam was performed (except for noted visual exam findings with Video Visits).  I discussed the limitations of evaluation and management by telemedicine and the availability of in person appointments. The patient expressed understanding and agreed to proceed.  I connected withNAME@ on 09/07/21 at  1:50 PM EST by a video enabled telemedicine application and verified that I am speaking with the correct person using two identifiers.  Present at visit: Luetta Nutting, [place]   Patient Location: Home [place] [address]   Provider location:   [facility]  Chief Complaint  Patient presents with   Mood    HPI  Krystal Hunter is a 41 y.o. female who presents via audio/video conferencing for a telehealth visit today.  Following up today for ADD symptoms.  She is doing quite well with Vyvanse at current strength of 40 mg daily.  She does find that medication tends to wear off around 3 to 4:00 in the afternoon and she finds her self struggling to get through tasks for the rest of the evening.   ROS:  A comprehensive ROS was completed and negative except as noted per HPI  Past Medical History:  Diagnosis Date   Anxiety    Depression    Varicose veins     Past Surgical History:  Procedure Laterality Date   CHOLECYSTECTOMY      Family History  Problem Relation Age of Onset   Thyroid disease Mother     Social History   Socioeconomic History   Marital status: Married    Spouse name: Not on file   Number of children: 2   Years of education: Not on file   Highest education level: Not on file   Occupational History   Not on file  Tobacco Use   Smoking status: Former   Smokeless tobacco: Never  Vaping Use   Vaping Use: Never used  Substance and Sexual Activity   Alcohol use: Yes    Comment: Occ   Drug use: No   Sexual activity: Yes    Comment: Husband has vasectomy  Other Topics Concern   Not on file  Social History Narrative   Not on file   Social Determinants of Health   Financial Resource Strain: Not on file  Food Insecurity: Not on file  Transportation Needs: Not on file  Physical Activity: Not on file  Stress: Not on file  Social Connections: Not on file  Intimate Partner Violence: Not on file     Current Outpatient Medications:    amphetamine-dextroamphetamine (ADDERALL) 10 MG tablet, Take in the afternoon as needed., Disp: 30 tablet, Rfl: 0   Chaste Tree (VITEX EXTRACT PO), Take 1,000 mg by mouth., Disp: , Rfl:    glucosamine-chondroitin 500-400 MG tablet, Take 1 tablet by mouth 3 (three) times daily., Disp: , Rfl:    lisdexamfetamine (VYVANSE) 40 MG capsule, Take 1 capsule (40 mg total) by mouth every morning., Disp: 30 capsule, Rfl: 0   meloxicam (MOBIC) 15 MG tablet, One tab PO qAM with a meal for 2 weeks, then daily prn pain., Disp: 30 tablet, Rfl: 3   Sodium Hyaluronate (  EUFLEXXA) 20 MG/2ML SOSY, x3 weeks.  Dx left Knee osteoarthritis, patient will bring syringes to me and I will inject.  Dispense #3 syringes, Disp: 6 mL, Rfl: 1   triazolam (HALCION) 0.25 MG tablet, 1-2 tabs PO 2 hours before procedure or imaging.  Do not drive with this medication., Disp: 2 tablet, Rfl: 0  EXAM:  VITALS per patient if applicable: Ht 5\' 4"  (1.626 m)    Wt 130 lb (59 kg)    LMP 08/15/2021 (Approximate)    BMI 22.31 kg/m   GENERAL: alert, oriented, appears well and in no acute distress  HEENT: atraumatic, conjunttiva clear, no obvious abnormalities on inspection of external nose and ears  NECK: normal movements of the head and neck  LUNGS: on inspection no signs  of respiratory distress, breathing rate appears normal, no obvious gross SOB, gasping or wheezing  CV: no obvious cyanosis  MS: moves all visible extremities without noticeable abnormality  PSYCH/NEURO: pleasant and cooperative, no obvious depression or anxiety, speech and thought processing grossly intact  ASSESSMENT AND PLAN:  Discussed the following assessment and plan:  Attention deficit disorder (ADD) in adult She doing well with Vyvanse at 40 mg daily.  She has having a crash in the mid afternoon.  Adding additional Adderall immediate release to use as needed in the afternoon at 10 mg daily.  We can titrate this as needed.     I discussed the assessment and treatment plan with the patient. The patient was provided an opportunity to ask questions and all were answered. The patient agreed with the plan and demonstrated an understanding of the instructions.   The patient was advised to call back or seek an in-person evaluation if the symptoms worsen or if the condition fails to improve as anticipated.    Luetta Nutting, DO

## 2021-09-07 NOTE — Assessment & Plan Note (Signed)
She doing well with Vyvanse at 40 mg daily.  She has having a crash in the mid afternoon.  Adding additional Adderall immediate release to use as needed in the afternoon at 10 mg daily.  We can titrate this as needed.

## 2021-09-15 NOTE — Telephone Encounter (Signed)
Need new benefits investigation done since it is a new year [employer] paperwork faxed to [employer] at [phone] Request is for Orthovisc Pt's insurance prefers Euflexxa Fax confirmation receipt received

## 2021-09-28 NOTE — Telephone Encounter (Signed)
Benefits Investigation Details received from [employer] Injection: Orthovisc  Medical: Deductible applies. Once the deductible has been met, patient is responsible for a coinsurance. Once the OOP has been met, patient is covered at 100%. Prior authorization is required f ro the drug through [place]. To initiate, faxthe provided form to [phone]. Beginning 02/25/21, Krystal Hunter will require extensive clinical records accompanying a PA unless a prior treatment within the last 365 days is on file for the drug.  PA required: Yes PA form and medical records faxed to [facility]  at [phone]  Fax confirmation received  Pharmacy: The product is not covered under the pharmacy plan.   Specialty Pharmacy: [facility]  May fill through: [place] Copay/Coinsurance: 25% Product Copay: 25% Administration Coinsurance: 25% Administration Copay:  Deductible: $6400 (met: $210.45) Out of Pocket Max: $6400 (met: $210.45)    PA submitted to [facility]

## 2021-10-06 ENCOUNTER — Other Ambulatory Visit: Payer: Self-pay | Admitting: Family Medicine

## 2021-10-06 MED ORDER — LISDEXAMFETAMINE DIMESYLATE 40 MG PO CAPS: 40.0000 mg | ORAL_CAPSULE | ORAL | 0 refills | Status: DC

## 2021-10-06 MED ORDER — AMPHETAMINE-DEXTROAMPHETAMINE 10 MG PO TABS: ORAL_TABLET | ORAL | 0 refills | Status: DC

## 2021-10-06 NOTE — Telephone Encounter (Signed)
Called [employer] to follow up on PA; spoke with [place]. He stated that their preferred agent is Euflexxa. New PA with records submitted for Euflexxa. Confirmation fax received.

## 2021-10-11 ENCOUNTER — Encounter: Payer: Self-pay | Admitting: Family Medicine

## 2021-10-11 MED ORDER — AMPHETAMINE-DEXTROAMPHETAMINE 20 MG PO TABS: 10.0000 mg | ORAL_TABLET | Freq: Every day | ORAL | 0 refills | Status: DC

## 2021-10-13 NOTE — Telephone Encounter (Signed)
PA determination received from [employer] Auth# [id number]  From 06/15/21 through 11/23/21.   PA has been approved for Orthovisc  Pt aware of approval and will discuss with spouse and return call as the entire cost is being applied to her deductible and her daughter needs surgery as well. Patient aware of the approaching cutoff date.

## 2021-10-19 MED ORDER — EUFLEXXA 20 MG/2ML IX SOSY: 1.0000 | PREFILLED_SYRINGE | INTRA_ARTICULAR | 1 refills | Status: DC

## 2021-10-19 NOTE — Addendum Note (Signed)
Addended by: Silverio Decamp on: 10/19/2021 02:53 PM   Modules accepted: Orders

## 2021-10-19 NOTE — Addendum Note (Signed)
Addended by: Carolin Coy on: 10/19/2021 01:13 PM   Modules accepted: Orders

## 2021-10-19 NOTE — Telephone Encounter (Signed)
Thank you, prescription signed off on, I am sure they are grateful that it finally went through.

## 2021-10-19 NOTE — Telephone Encounter (Signed)
Spoke with patient and spouse, they are willing to proceed. Prescription pended for approval.

## 2021-10-19 NOTE — Telephone Encounter (Signed)
Approval is for Euflexxa not orthovisc.

## 2021-10-21 NOTE — Telephone Encounter (Signed)
Fax rec'd from [facility] that they need the proof of PA. PA authorization faxed to [facility]. Confirmation rec'd.

## 2021-10-28 ENCOUNTER — Other Ambulatory Visit: Payer: Self-pay

## 2021-10-28 ENCOUNTER — Encounter: Payer: Self-pay | Admitting: Sports Medicine

## 2021-10-28 MED ORDER — EUFLEXXA 20 MG/2ML IX SOSY: 1.0000 | PREFILLED_SYRINGE | INTRA_ARTICULAR | 1 refills | Status: DC

## 2021-10-28 NOTE — Telephone Encounter (Signed)
Patient sent a mychart msg stating she wanted to schedule the gel injections. Call to [facility] to check the status of the shipment of Euflexxa. They stated they would need the patient to call to authorize shipment and a new prescription would be needed. New prescription sent. Mychart msg to patient with this information.  ?

## 2021-11-04 NOTE — Telephone Encounter (Signed)
Spoke with Jasmine at [facility] who extended the current authorization til 12/24/2021 to allow patient time to get her injections. Also, spoke with [facility] pharmacy who stated that patient's euflexxa was scheduled to ship today.  ?

## 2021-11-07 ENCOUNTER — Other Ambulatory Visit: Payer: Self-pay | Admitting: Family Medicine

## 2021-11-07 MED ORDER — LISDEXAMFETAMINE DIMESYLATE 40 MG PO CAPS: 40.0000 mg | ORAL_CAPSULE | ORAL | 0 refills | Status: DC

## 2021-11-08 NOTE — Telephone Encounter (Signed)
Appts have been scheduled.  

## 2021-11-08 NOTE — Telephone Encounter (Signed)
Medication arrived today. Please call patient to schedule 3 once weekly appts with Dr. Karie Schwalbe for injections of Euflexxa.  ?

## 2021-11-09 ENCOUNTER — Other Ambulatory Visit: Payer: Self-pay | Admitting: Family Medicine

## 2021-11-09 MED ORDER — LISDEXAMFETAMINE DIMESYLATE 40 MG PO CAPS: 40.0000 mg | ORAL_CAPSULE | ORAL | 0 refills | Status: DC

## 2021-11-11 ENCOUNTER — Other Ambulatory Visit: Payer: Self-pay

## 2021-11-11 ENCOUNTER — Ambulatory Visit (INDEPENDENT_AMBULATORY_CARE_PROVIDER_SITE_OTHER): Payer: Managed Care, Other (non HMO)

## 2021-11-11 ENCOUNTER — Ambulatory Visit: Payer: Managed Care, Other (non HMO) | Admitting: Sports Medicine

## 2021-11-11 DIAGNOSIS — M25562 Pain in left knee: Secondary | ICD-10-CM

## 2021-11-11 NOTE — Progress Notes (Signed)
? ? ?  Procedures performed today:   ? ?Procedure: Real-time Ultrasound Guided injection of the left knee ?Device: Samsung HS60  ?Verbal informed consent obtained.  ?Time-out conducted.  ?Noted no overlying erythema, induration, or other signs of local infection.  ?Skin prepped in a sterile fashion.  ?Local anesthesia: Topical Ethyl chloride.  ?With sterile technique and under real time ultrasound guidance: Noted trace effusion, 1 syringe of Euflexxa injected easily into the suprapatellar recess with a 22-gauge needle. ?Completed without difficulty  ?Advised to call if fevers/chills, erythema, induration, drainage, or persistent bleeding.  ?Images permanently stored and available for review in PACS.  ?Impression: Technically successful ultrasound guided injection. ? ?Independent interpretation of notes and tests performed by another provider:  ? ?None. ? ?Brief History, Exam, Impression, and Recommendations:   ? 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 ? ? ? ?___________________________________________ ?Ihor Austin. Benjamin Stain, M.D., ABFM., CAQSM. ?Primary Care and Sports Medicine ?[name] ? ?Adjunct Instructor of Family Medicine  ?[employer] ?

## 2021-11-11 NOTE — Assessment & Plan Note (Signed)
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 ?

## 2021-11-16 ENCOUNTER — Ambulatory Visit (INDEPENDENT_AMBULATORY_CARE_PROVIDER_SITE_OTHER): Payer: Managed Care, Other (non HMO)

## 2021-11-16 ENCOUNTER — Other Ambulatory Visit: Payer: Self-pay

## 2021-11-16 ENCOUNTER — Ambulatory Visit: Payer: Managed Care, Other (non HMO) | Admitting: Sports Medicine

## 2021-11-16 DIAGNOSIS — G8929 Other chronic pain: Secondary | ICD-10-CM | POA: Diagnosis not present

## 2021-11-16 DIAGNOSIS — M25562 Pain in left knee: Secondary | ICD-10-CM

## 2021-11-16 NOTE — Assessment & Plan Note (Signed)
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 ?Return in 1 week for Euflexxa No. 3 of 3 left knee. ?

## 2021-11-16 NOTE — Progress Notes (Signed)
? ? ?  Procedures performed today:   ? ?Procedure: Real-time Ultrasound Guided injection of the left knee ?Device: Samsung HS60  ?Verbal informed consent obtained.  ?Time-out conducted.  ?Noted no overlying erythema, induration, or other signs of local infection.  ?Skin prepped in a sterile fashion.  ?Local anesthesia: Topical Ethyl chloride.  ?With sterile technique and under real time ultrasound guidance: Noted trace effusion, 1 syringe of Euflexxa injected easily into the suprapatellar recess with a 22-gauge needle. ?Completed without difficulty  ?Advised to call if fevers/chills, erythema, induration, drainage, or persistent bleeding.  ?Images permanently stored and available for review in PACS.  ?Impression: Technically successful ultrasound guided injection. ? ?Independent interpretation of notes and tests performed by another provider:  ? ?None. ? ?Brief History, Exam, Impression, and Recommendations:   ? 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 ?Return in 1 week for Euflexxa No. 3 of 3 left knee. ? ? ? ?___________________________________________ ?Krystal Hunter. Dianah Field, M.D., ABFM., CAQSM. ?Primary Care and Sports Medicine ?[facility] ? ?Adjunct Instructor of Family Medicine  ?[employer] ?

## 2021-11-17 ENCOUNTER — Telehealth: Payer: Self-pay

## 2021-11-17 NOTE — Telephone Encounter (Addendum)
Initiated Prior authorization [id number] 40MG capsules ?Via: Covermymeds ?Case/Key:[id number] ?Status: denied as of 11/17/21 ?Reason:There is nothing to support that the individual has had inadequate response, contraindication, or is  ?intolerant to all the following covered alternatives: A. dexmethylphenidate ER (generic for Focalin  ?XR); B. dextroamphetamine/amphetamine ER (generic for Adderall XR); and C. methylphenidate  ?ER capsules (generic for Ritalin LA or generic for Aptensio XR) or methylphenidate ER tablets  ?(generic for Concerta). ?Vyvanse capsule is medically necessary when the individual meets both of the following (1 and 2):  ?1. Documented diagnosis of one of the following (A, B, C, D, E, or F): A. Attention Deficit  ?Hyperactivity Disorder (ADD/ADHD); B. Binge-eating disorder in an adult (42 years of age and  ?older); C. Narcolepsy; D. Adjunctive/augmentation treatment for depression and meets both of the  ?following (i and ii): i. Individual is 78 years of age or older; and ii. Individual is concurrently  ?receiving other medication therapy for depression (for example, selective serotonin reuptake  ?inhibitors [SSRIs]); E. Fatigue associated with cancer and/or its treatment; or F. Idiopathic  ?Hypersomnolence and meets the following: i. Diagnosis is confirmed by a sleep specialist  ?physician or at an institution (sleep center) that specializes in sleep disorders; and 2. [does not  ?apply to diagnosis of binge-eating disorder] There is documentation the individual has had an  ?inadequate response, contraindication, or is intolerant to all of the following: A. dexmethylphenidate  ?ER (generic for Focalin XR); B. dextroamphetamine/amphetamine ER (generic for Adderall XR);  ?and C. methylphenidate ER capsules (generic for Ritalin LA or generic for Aptensio XRmethylphenidate ER tablets (generic for Concerta). Attention Deficit Hyperactivity Disorder (ADHD)  ?stimulants are considered medically  necessary for continued use when initial criteria are met and  ?there is documentation of beneficial response. Any other use is considered experimental,  ?investigational or unproven, including the following (this list may not be all inclusive): 1. Fatigue  ?associated with Multiple Sclerosis (MS); 2. Long-term combination therapy (that is, greater 2  ?months) with Strattera and Central Nervous System (CNS) Stimulants for the treatment of  ?ADD/ADHD (for example, mixed amphetamine salts extended-release capsules [Adderall XR,  ?generics], methylphenidate extended-release tablets, methylphenidate immediate-release tablets);  ?3. Neuroenhancement; and/or 4. Weight Loss. ?Notified Pt via: Mychart ?

## 2021-11-18 ENCOUNTER — Ambulatory Visit: Payer: Managed Care, Other (non HMO) | Admitting: Sports Medicine

## 2021-11-23 ENCOUNTER — Ambulatory Visit (INDEPENDENT_AMBULATORY_CARE_PROVIDER_SITE_OTHER): Payer: Managed Care, Other (non HMO)

## 2021-11-23 ENCOUNTER — Ambulatory Visit: Payer: Managed Care, Other (non HMO) | Admitting: Sports Medicine

## 2021-11-23 DIAGNOSIS — G8929 Other chronic pain: Secondary | ICD-10-CM

## 2021-11-23 DIAGNOSIS — M25562 Pain in left knee: Secondary | ICD-10-CM

## 2021-11-23 NOTE — Assessment & Plan Note (Addendum)
Euflexxa 3 of 3 today, return to see me 1 month as needed. ?Of note she is going to [employer] next week. ?

## 2021-11-23 NOTE — Progress Notes (Signed)
? ? ?  Procedures performed today:   ? ?Procedure: Real-time Ultrasound Guided injection of the left knee ?Device: Samsung HS60  ?Verbal informed consent obtained.  ?Time-out conducted.  ?Noted no overlying erythema, induration, or other signs of local infection.  ?Skin prepped in a sterile fashion.  ?Local anesthesia: Topical Ethyl chloride.  ?With sterile technique and under real time ultrasound guidance: Noted trace effusion, 1 syringe of Euflexxa injected easily into the suprapatellar recess with a 22-gauge needle. ?Completed without difficulty  ?Advised to call if fevers/chills, erythema, induration, drainage, or persistent bleeding.  ?Images permanently stored and available for review in PACS.  ?Impression: Technically successful ultrasound guided injection. ? ?Independent interpretation of notes and tests performed by another provider:  ? ?None. ? ?Brief History, Exam, Impression, and Recommendations:   ? 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 ?Of note she is going to [employer] next week. ? ? ? ?___________________________________________ ?Ihor Austin. Benjamin Stain, M.D., ABFM., CAQSM. ?Primary Care and Sports Medicine ?[name] ? ?Adjunct Instructor of Family Medicine  ?[employer] ?

## 2021-11-25 ENCOUNTER — Ambulatory Visit: Payer: Managed Care, Other (non HMO) | Admitting: Sports Medicine

## 2021-12-06 ENCOUNTER — Other Ambulatory Visit: Payer: Self-pay | Admitting: Family Medicine

## 2021-12-06 ENCOUNTER — Encounter: Payer: Self-pay | Admitting: Family Medicine

## 2021-12-06 MED ORDER — LISDEXAMFETAMINE DIMESYLATE 40 MG PO CAPS: 40.0000 mg | ORAL_CAPSULE | ORAL | 0 refills | Status: DC

## 2021-12-07 IMAGING — DX DG KNEE COMPLETE 4+V*L*
4 series · 4 of 4 positions shown · non-contrast
Comparison: None.

CLINICAL DATA: Pain after running

EXAM:
LEFT KNEE - COMPLETE 4+ VIEW

[tunnel]
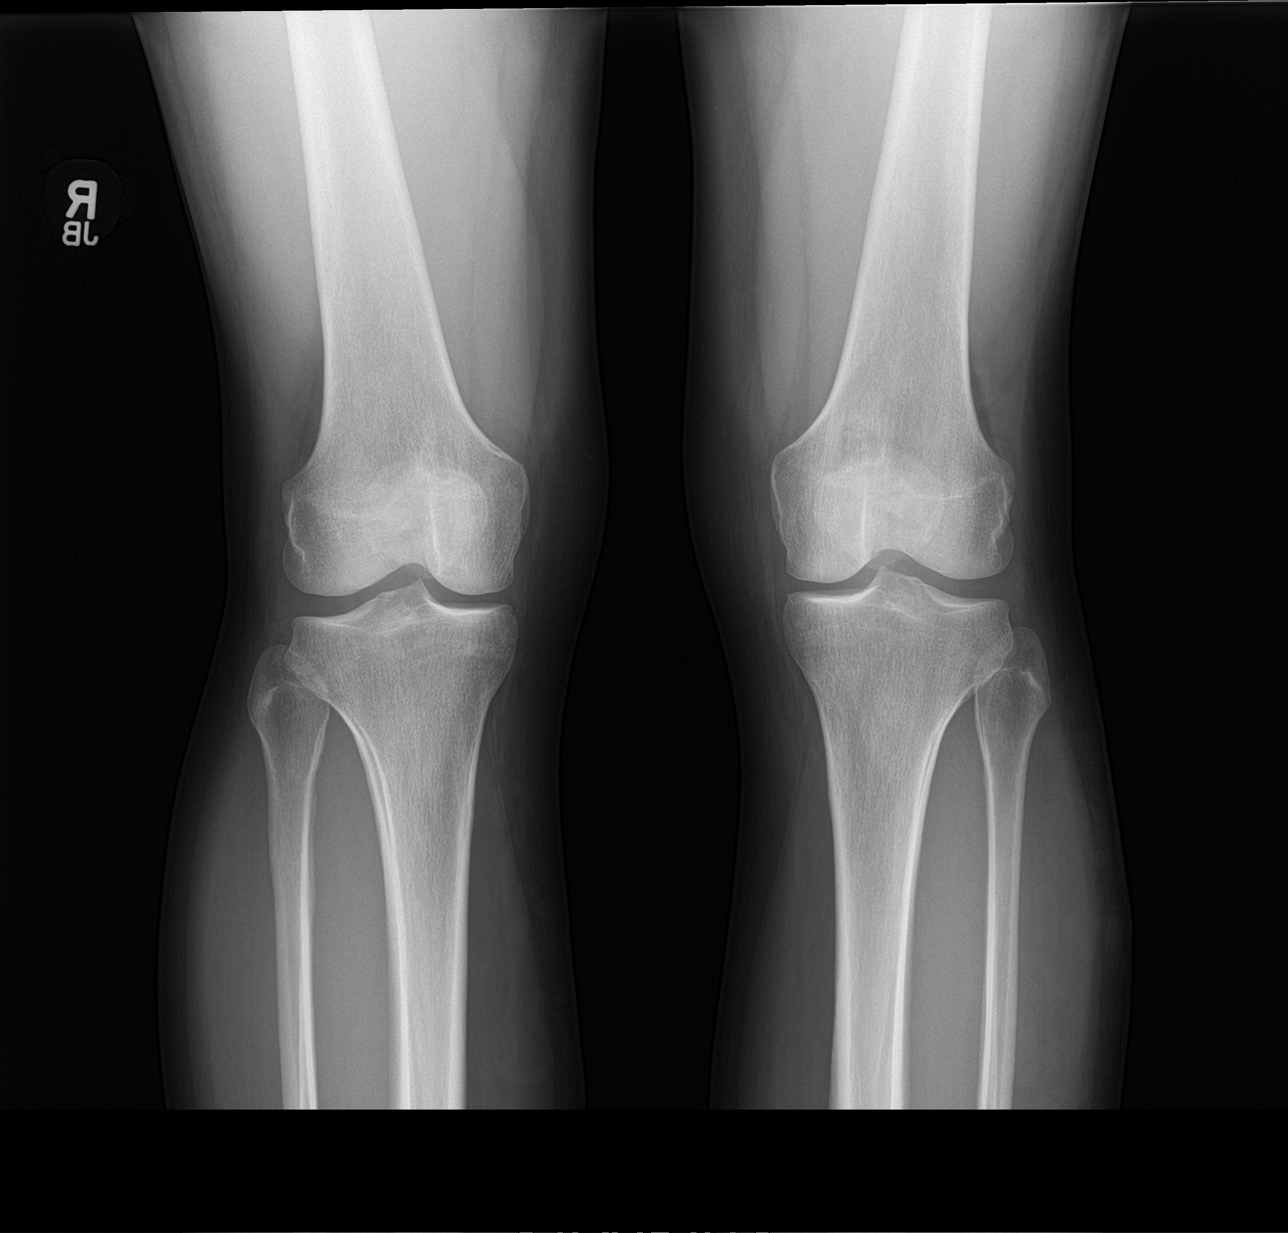

[knee lat]
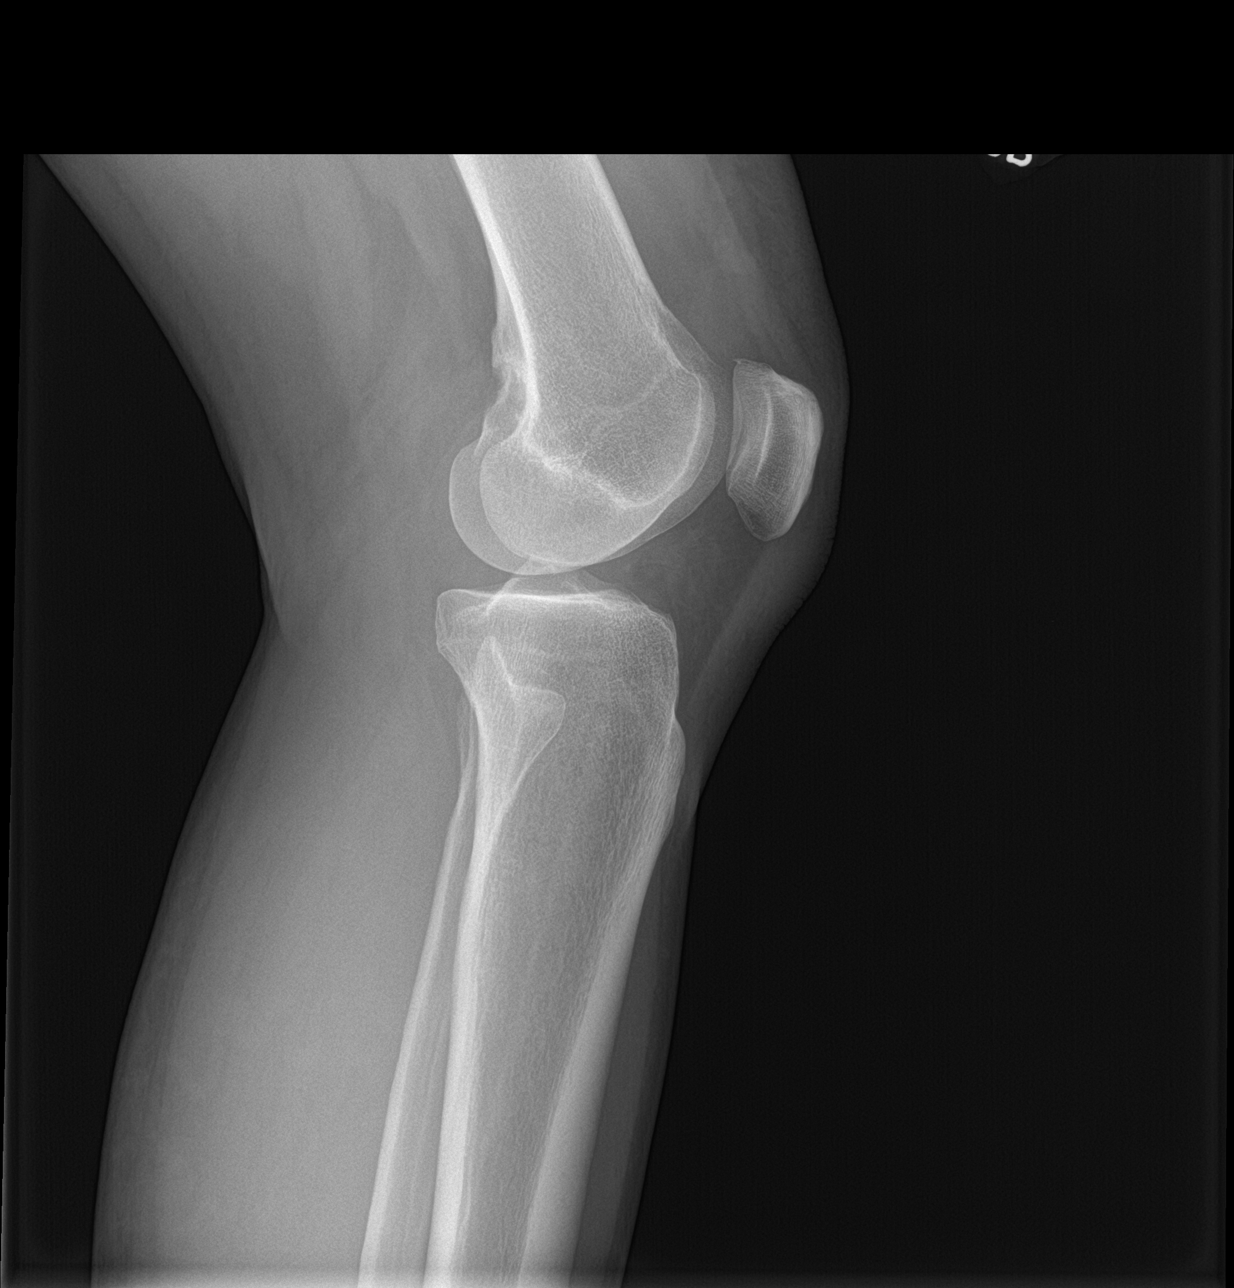

[knee sunrise]
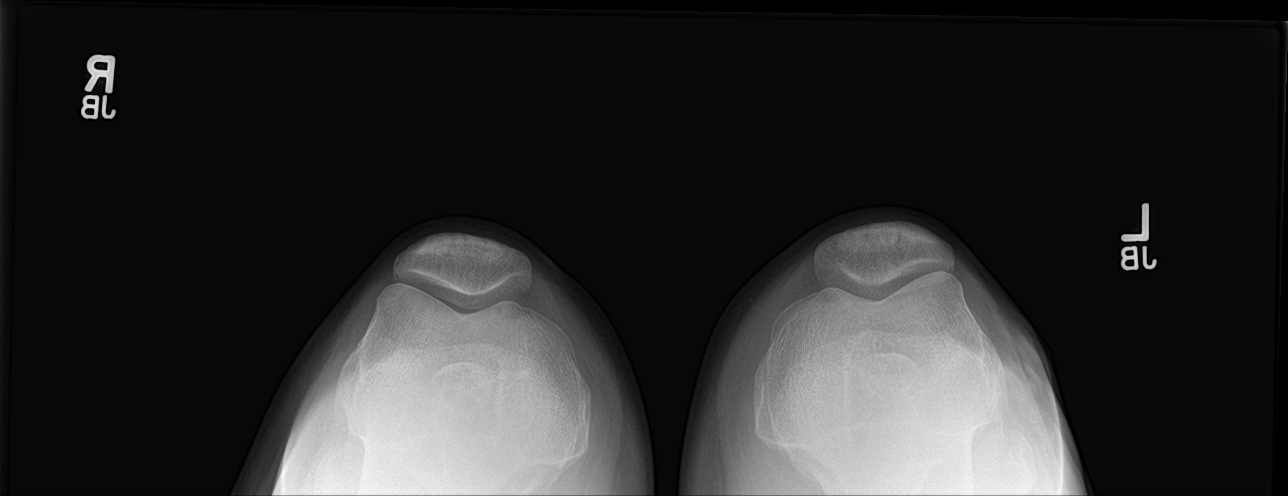

[knee ap bilat standing]
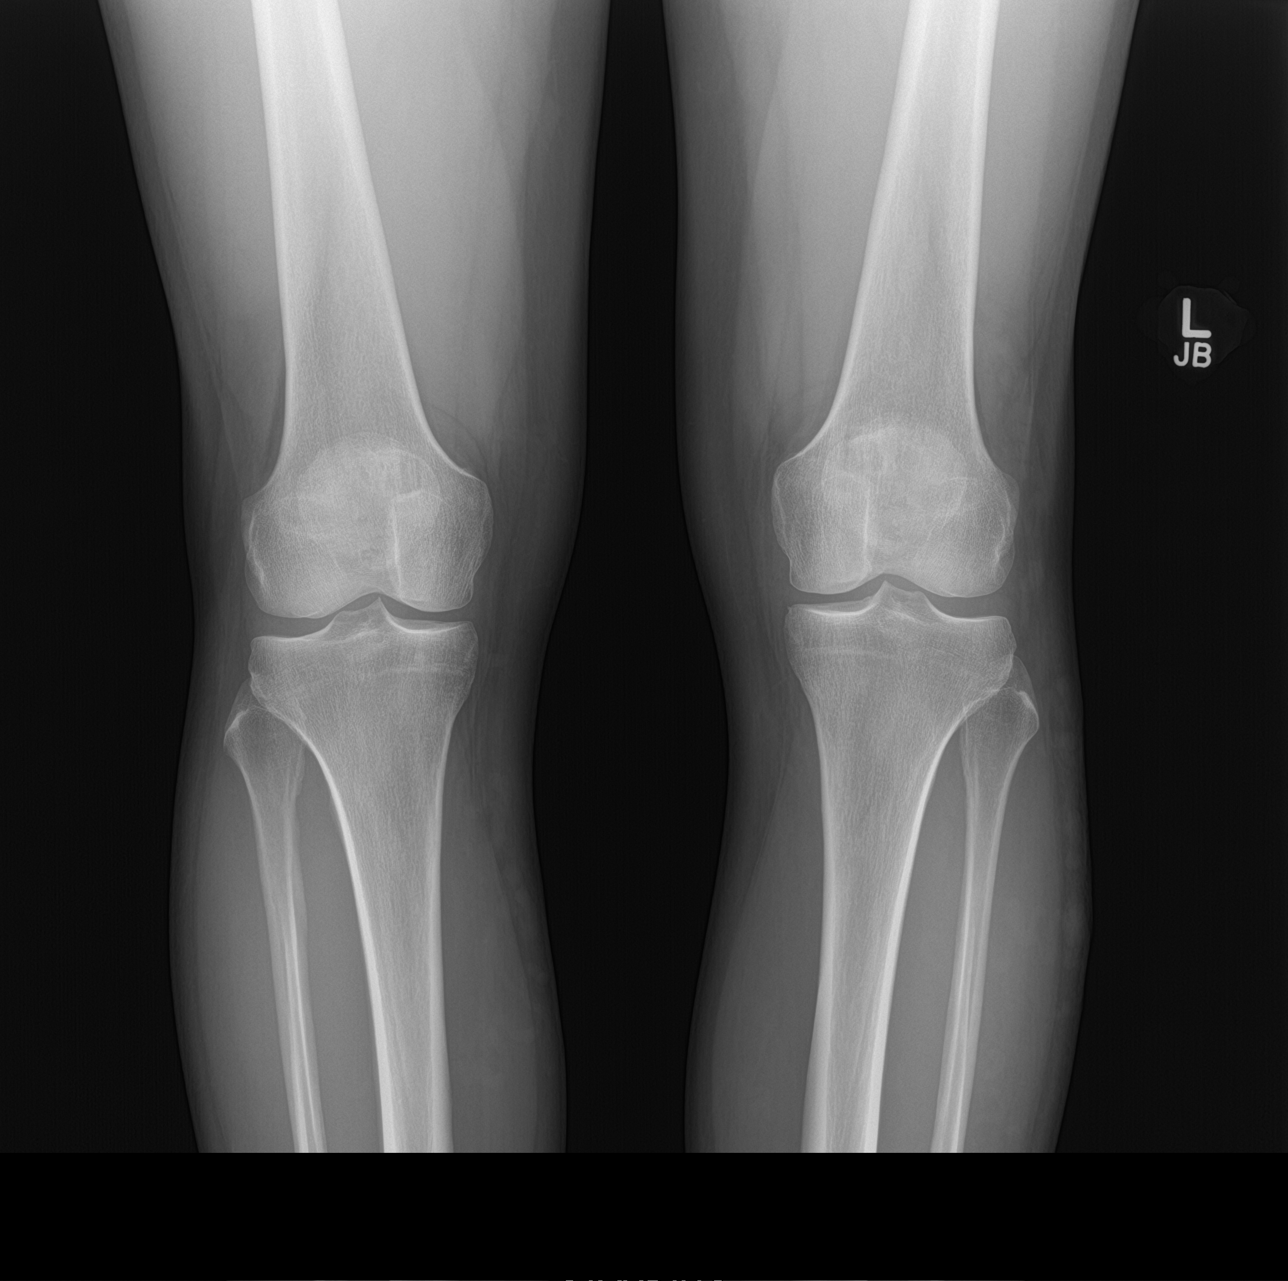

[4 of 4 positions shown; findings below may reference images not displayed]

FINDINGS: Standing frontal, standing lateral, standing tunnel, and sunrise
patellar images were obtained. There is no fracture or dislocation.
No joint effusion. Joint spaces appear normal. No erosive change.
IMPRESSION: No evident fracture, dislocation, or joint effusion. No evident
arthropathy.

## 2021-12-21 ENCOUNTER — Ambulatory Visit: Payer: Managed Care, Other (non HMO) | Admitting: Sports Medicine

## 2022-01-05 ENCOUNTER — Other Ambulatory Visit: Payer: Self-pay | Admitting: Family Medicine

## 2022-01-06 MED ORDER — LISDEXAMFETAMINE DIMESYLATE 40 MG PO CAPS: 40.0000 mg | ORAL_CAPSULE | ORAL | 0 refills | Status: DC

## 2022-01-06 MED ORDER — AMPHETAMINE-DEXTROAMPHETAMINE 20 MG PO TABS: 10.0000 mg | ORAL_TABLET | Freq: Every day | ORAL | 0 refills | Status: DC

## 2022-02-06 ENCOUNTER — Other Ambulatory Visit: Payer: Self-pay | Admitting: Family Medicine

## 2022-02-06 MED ORDER — LISDEXAMFETAMINE DIMESYLATE 40 MG PO CAPS: 40.0000 mg | ORAL_CAPSULE | ORAL | 0 refills | Status: DC

## 2022-02-06 NOTE — Telephone Encounter (Signed)
Due for follow up next month.   CM

## 2022-02-06 NOTE — Telephone Encounter (Signed)
Please review refill request.  I am not authorized to either refuse or approve refill request for this medication.  T. Jaylend Reiland, CMA 

## 2022-02-07 NOTE — Telephone Encounter (Signed)
Called x1, VM box full. Could not LVM. Will attempt again later. AMUCK

## 2022-02-07 NOTE — Telephone Encounter (Signed)
Patient has been scheduled for 03/20/22. AMUCK 

## 2022-03-06 ENCOUNTER — Other Ambulatory Visit: Payer: Self-pay | Admitting: Family Medicine

## 2022-03-06 MED ORDER — LISDEXAMFETAMINE DIMESYLATE 40 MG PO CAPS: 40.0000 mg | ORAL_CAPSULE | ORAL | 0 refills | Status: DC

## 2022-03-06 NOTE — Telephone Encounter (Signed)
Patient is scheduled for 03/20/2022.

## 2022-03-20 ENCOUNTER — Ambulatory Visit (INDEPENDENT_AMBULATORY_CARE_PROVIDER_SITE_OTHER): Payer: Managed Care, Other (non HMO) | Admitting: Family Medicine

## 2022-03-20 ENCOUNTER — Encounter: Payer: Self-pay | Admitting: Family Medicine

## 2022-03-20 DIAGNOSIS — F988 Other specified behavioral and emotional disorders with onset usually occurring in childhood and adolescence: Secondary | ICD-10-CM | POA: Diagnosis not present

## 2022-03-20 MED ORDER — LISDEXAMFETAMINE DIMESYLATE 50 MG PO CAPS: 50.0000 mg | ORAL_CAPSULE | ORAL | 0 refills | Status: DC

## 2022-03-20 NOTE — Assessment & Plan Note (Signed)
She is tolerating stimulants well.  Effects of vyvanse are wearing off earlier.  Changing ot vyvanse 50mg .  She will let me know if this is effective after a few weeks.  Continue adderall prn in the afternoon as well.

## 2022-03-20 NOTE — Progress Notes (Signed)
Krystal Hunter - 41 y.o. female MRN [id number]  Date of birth: [birth year]  Subjective Chief Complaint  Patient presents with   Follow-up    HPI Krystal Hunter is a 41 y.o. female here today for follow up of ADD.    Currently treated with vyvanse 40mg  daily with adderall 20mg  in the afternoon as needed.  She feels that the vyvanse at current strength is not working as well for her.  Feels that effects wear off earlier and she is needing to use adderall earlier in the day.  The adderall does work well for her.  She has not noted any side effects from these medications.    ROS:  A comprehensive ROS was completed and negative except as noted per HPI  No Known Allergies  Past Medical History:  Diagnosis Date   Anxiety    Depression    Varicose veins     Past Surgical History:  Procedure Laterality Date   CHOLECYSTECTOMY      Social History   Socioeconomic History   Marital status: Married    Spouse name: Not on file   Number of children: 2   Years of education: Not on file   Highest education level: Not on file  Occupational History   Not on file  Tobacco Use   Smoking status: Former   Smokeless tobacco: Never  Vaping Use   Vaping Use: Never used  Substance and Sexual Activity   Alcohol use: Yes    Comment: Occ   Drug use: No   Sexual activity: Yes    Comment: Husband has vasectomy  Other Topics Concern   Not on file  Social History Narrative   Not on file   Social Determinants of Health   Financial Resource Strain: Not on file  Food Insecurity: Not on file  Transportation Needs: Not on file  Physical Activity: Sufficiently Active (02/26/2018)   Exercise Vital Sign    Days of Exercise per Week: 6 days    Minutes of Exercise per Session: 30 min  Stress: Not on file  Social Connections: Not on file    Family History  Problem Relation Age of Onset   Thyroid disease Mother     Health Maintenance  Topic Date Due   PAP SMEAR-Modifier  03/20/2022  (Originally 06/01/2019)   COVID-19 Vaccine (3 - Moderna series) 06/20/2022 (Originally 02/27/2020)   Hepatitis C Screening  03/21/2023 (Originally 07/12/1998)   INFLUENZA VACCINE  03/28/2022   TETANUS/TDAP  05/11/2026   HIV Screening  Completed   HPV VACCINES  Aged Out     ----------------------------------------------------------------------------------------------------------------------------------------------------------------------------------------------------------------- Physical Exam BP 127/79 (BP Location: Left Arm, Patient Position: Sitting, Cuff Size: Small)   Pulse 77   Ht 5\' 4"  (1.626 m)   Wt 128 lb (58.1 kg)   SpO2 100%   BMI 21.97 kg/m   Physical Exam Constitutional:      Appearance: Normal appearance.  Eyes:     General: No scleral icterus. Cardiovascular:     Rate and Rhythm: Normal rate and regular rhythm.  Pulmonary:     Effort: Pulmonary effort is normal.     Breath sounds: Normal breath sounds.  Neurological:     Mental Status: She is alert.  Psychiatric:        Mood and Affect: Mood normal.        Behavior: Behavior normal.     ------------------------------------------------------------------------------------------------------------------------------------------------------------------------------------------------------------------- Assessment and Plan  Attention deficit disorder (ADD) in adult She is tolerating stimulants well.  Effects of vyvanse  are wearing off earlier.  Changing ot vyvanse 50mg .  She will let me know if this is effective after a few weeks.  Continue adderall prn in the afternoon as well.     Meds ordered this encounter  Medications   lisdexamfetamine (VYVANSE) 50 MG capsule    Sig: Take 1 capsule (50 mg total) by mouth every morning.    Dispense:  30 capsule    Refill:  0   lisdexamfetamine (VYVANSE) 50 MG capsule    Sig: Take 1 capsule (50 mg total) by mouth every morning.    Dispense:  30 capsule    Refill:  0     Fill in 60 days.   lisdexamfetamine (VYVANSE) 50 MG capsule    Sig: Take 1 capsule (50 mg total) by mouth every morning.    Dispense:  30 capsule    Refill:  0    Fill in 30 days.    Return in about 6 months (around 09/20/2022) for ADD-OK for virtual if needed. .    This visit occurred during the SARS-CoV-2 public health emergency.  Safety protocols were in place, including screening questions prior to the visit, additional usage of staff PPE, and extensive cleaning of exam room while observing appropriate contact time as indicated for disinfecting solutions.

## 2022-03-20 NOTE — Patient Instructions (Signed)
Great to see you today! Let's try increase to Vyvanse 50mg  daily. Let me know after a few weeks if this is working better for you.

## 2022-04-18 ENCOUNTER — Other Ambulatory Visit: Payer: Self-pay | Admitting: Family Medicine

## 2022-04-18 MED ORDER — LISDEXAMFETAMINE DIMESYLATE 50 MG PO CAPS: 50.0000 mg | ORAL_CAPSULE | ORAL | 0 refills | Status: DC

## 2022-04-18 MED ORDER — AMPHETAMINE-DEXTROAMPHETAMINE 20 MG PO TABS: 10.0000 mg | ORAL_TABLET | Freq: Every day | ORAL | 0 refills | Status: DC

## 2022-04-18 NOTE — Telephone Encounter (Signed)
Adderall  Last OV: 03/20/22 Next OV: none on file Last RF: 01/06/22  Vyvase Last OV: 03/20/22 Next OV: none on file Last RF: 03/20/22

## 2022-05-19 ENCOUNTER — Other Ambulatory Visit: Payer: Self-pay | Admitting: Family Medicine

## 2022-05-19 MED ORDER — LISDEXAMFETAMINE DIMESYLATE 50 MG PO CAPS: 50.0000 mg | ORAL_CAPSULE | ORAL | 0 refills | Status: DC

## 2022-05-22 ENCOUNTER — Encounter: Payer: Self-pay | Admitting: Family Medicine

## 2022-05-22 MED ORDER — AMPHETAMINE-DEXTROAMPHET ER 20 MG PO CP24: 20.0000 mg | ORAL_CAPSULE | Freq: Every day | ORAL | 0 refills | Status: DC

## 2022-06-22 ENCOUNTER — Encounter: Payer: Self-pay | Admitting: Family Medicine

## 2022-06-22 MED ORDER — AMPHETAMINE-DEXTROAMPHET ER 20 MG PO CP24: 20.0000 mg | ORAL_CAPSULE | Freq: Every day | ORAL | 0 refills | Status: DC

## 2022-07-03 MED ORDER — AMPHETAMINE-DEXTROAMPHET ER 25 MG PO CP24: 25.0000 mg | ORAL_CAPSULE | ORAL | 0 refills | Status: DC

## 2022-07-03 NOTE — Addendum Note (Signed)
Addended by: Luetta Nutting E on: 07/03/2022 11:01 AM   Modules accepted: Orders

## 2022-07-17 ENCOUNTER — Telehealth: Payer: Self-pay

## 2022-07-17 NOTE — Telephone Encounter (Signed)
Initiated Prior authorization GBT:[id number] ER 20MG  er capsules Via: Covermymeds Case/Key:[id number] Status: approved  as of 07/17/22 Reason:Coverage Start Date:06/28/2022;Coverage End Date:06/28/2023; Notified Pt via: Mychart

## 2022-07-23 ENCOUNTER — Other Ambulatory Visit: Payer: Self-pay | Admitting: Family Medicine

## 2022-07-24 MED ORDER — AMPHETAMINE-DEXTROAMPHETAMINE 20 MG PO TABS: 10.0000 mg | ORAL_TABLET | Freq: Every day | ORAL | 0 refills | Status: DC

## 2022-07-24 NOTE — Telephone Encounter (Signed)
Last OV: 03/20/22 Next OV: no appt scheduled Last RF: 07/03/22

## 2022-08-01 ENCOUNTER — Other Ambulatory Visit: Payer: Self-pay | Admitting: Family Medicine

## 2022-08-03 MED ORDER — AMPHETAMINE-DEXTROAMPHET ER 25 MG PO CP24: 25.0000 mg | ORAL_CAPSULE | ORAL | 0 refills | Status: DC

## 2022-09-02 ENCOUNTER — Other Ambulatory Visit: Payer: Self-pay | Admitting: Family Medicine

## 2022-09-05 ENCOUNTER — Other Ambulatory Visit: Payer: Self-pay | Admitting: Family Medicine

## 2022-09-05 MED ORDER — AMPHETAMINE-DEXTROAMPHET ER 25 MG PO CP24: 25.0000 mg | ORAL_CAPSULE | ORAL | 0 refills | Status: DC

## 2022-09-05 NOTE — Telephone Encounter (Signed)
Last OV: 03/20/2022 Next OV: no visits  Last Labs: 07/15/2020 Last RF: 08/03/2022

## 2022-09-05 NOTE — Telephone Encounter (Signed)
Refilling x1 month.  Needs f/u visit.    Thanks!  CM

## 2022-10-02 ENCOUNTER — Other Ambulatory Visit: Payer: Self-pay | Admitting: Family Medicine

## 2022-10-03 ENCOUNTER — Encounter: Payer: Self-pay | Admitting: Family Medicine

## 2022-10-04 ENCOUNTER — Other Ambulatory Visit: Payer: Self-pay | Admitting: Family Medicine

## 2022-10-04 MED ORDER — AMPHETAMINE-DEXTROAMPHET ER 25 MG PO CP24: 25.0000 mg | ORAL_CAPSULE | ORAL | 0 refills | Status: DC

## 2022-10-05 ENCOUNTER — Other Ambulatory Visit: Payer: Self-pay | Admitting: Family Medicine

## 2022-10-05 MED ORDER — DOXYCYCLINE HYCLATE 100 MG PO TABS: 100.0000 mg | ORAL_TABLET | Freq: Two times a day (BID) | ORAL | 0 refills | Status: DC

## 2022-10-10 ENCOUNTER — Telehealth (INDEPENDENT_AMBULATORY_CARE_PROVIDER_SITE_OTHER): Payer: Managed Care, Other (non HMO) | Admitting: Family Medicine

## 2022-10-10 ENCOUNTER — Encounter: Payer: Self-pay | Admitting: Family Medicine

## 2022-10-10 VITALS — Ht 64.0 in | Wt 130.0 lb

## 2022-10-10 DIAGNOSIS — F418 Other specified anxiety disorders: Secondary | ICD-10-CM

## 2022-10-10 DIAGNOSIS — J0100 Acute maxillary sinusitis, unspecified: Secondary | ICD-10-CM

## 2022-10-10 DIAGNOSIS — D509 Iron deficiency anemia, unspecified: Secondary | ICD-10-CM

## 2022-10-10 DIAGNOSIS — J329 Chronic sinusitis, unspecified: Secondary | ICD-10-CM | POA: Insufficient documentation

## 2022-10-10 MED ORDER — AMPHETAMINE-DEXTROAMPHETAMINE 20 MG PO TABS: 10.0000 mg | ORAL_TABLET | Freq: Every day | ORAL | 0 refills | Status: DC

## 2022-10-10 MED ORDER — AMPHETAMINE-DEXTROAMPHET ER 25 MG PO CP24: 25.0000 mg | ORAL_CAPSULE | ORAL | 0 refills | Status: DC

## 2022-10-10 NOTE — Progress Notes (Signed)
Medication refill of Adderall.

## 2022-10-10 NOTE — Assessment & Plan Note (Signed)
She will complete course of doxycycline.

## 2022-10-10 NOTE — Progress Notes (Signed)
Krystal Hunter - 42 y.o. female MRN [id number]  Date of birth: 27-Dec-1980   This visit type was conducted due to national recommendations for restrictions regarding the COVID-19 Pandemic (e.g. social distancing).  This format is felt to be most appropriate for this patient at this time.  All issues noted in this document were discussed and addressed.  No physical exam was performed (except for noted visual exam findings with Video Visits).  I discussed the limitations of evaluation and management by telemedicine and the availability of in person appointments. The patient expressed understanding and agreed to proceed.  I connected withNAME@ on 10/10/22 at  3:10 PM EST by a video enabled telemedicine application and verified that I am speaking with the correct person using two identifiers.  Present at visit: Luetta Nutting, [place]   Patient Location: [place] [address]   Provider location:   [facility]  Chief Complaint  Patient presents with   Medication Problem    HPI  Krystal Hunter is a 42 y.o. female who presents via audio/video conferencing for a telehealth visit today.  She is following up for ADD.  She reports that she is doing pretty well with current strength of Adderall XR 25m with Adderall 296min the afternoon.  No significant side effects from medication.  May have some mild seasonal depression, this does seem to be improving some.   Recent sinusitis as well treated with doxycycline.  Much improved with this.   ROS:  A comprehensive ROS was completed and negative except as noted per HPI  Past Medical History:  Diagnosis Date   Anxiety    Depression    Varicose veins     Past Surgical History:  Procedure Laterality Date   CHOLECYSTECTOMY      Family History  Problem Relation Age of Onset   Thyroid disease Mother     Social History   Socioeconomic History   Marital status: Married    Spouse name: Not on file    Number of children: 2   Years of education: Not on file   Highest education level: Not on file  Occupational History   Not on file  Tobacco Use   Smoking status: Former   Smokeless tobacco: Never  Vaping Use   Vaping Use: Never used  Substance and Sexual Activity   Alcohol use: Yes    Comment: Occ   Drug use: No   Sexual activity: Yes    Comment: Husband has vasectomy  Other Topics Concern   Not on file  Social History Narrative   Not on file   Social Determinants of Health   Financial Resource Strain: Not on file  Food Insecurity: Not on file  Transportation Needs: Not on file  Physical Activity: Sufficiently Active (02/26/2018)   Exercise Vital Sign    Days of Exercise per Week: 6 days    Minutes of Exercise per Session: 30 min  Stress: Not on file  Social Connections: Not on file  Intimate Partner Violence: Not on file     Current Outpatient Medications:    meloxicam (MOBIC) 15 MG tablet, One tab PO qAM with a meal for 2 weeks, then daily prn pain., Disp: 30 tablet, Rfl: 3   amphetamine-dextroamphetamine (ADDERALL XR) 25 MG 24 hr capsule, Take 1 capsule by mouth every morning., Disp: 30 capsule, Rfl: 0   amphetamine-dextroamphetamine (ADDERALL) 20 MG tablet, Take 0.5 tablets (10 mg total) by mouth daily in the afternoon. Take in the  afternoon as needed., Disp: 45 tablet, Rfl: 0  EXAM:  VITALS per patient if applicable: Ht 5' 4"$  (1.626 m)   Wt 130 lb (59 kg)   BMI 22.31 kg/m   GENERAL: alert, oriented, appears well and in no acute distress  HEENT: atraumatic, conjunttiva clear, no obvious abnormalities on inspection of external nose and ears  NECK: normal movements of the head and neck  LUNGS: on inspection no signs of respiratory distress, breathing rate appears normal, no obvious gross SOB, gasping or wheezing  CV: no obvious cyanosis  MS: moves all visible extremities without noticeable abnormality  PSYCH/NEURO: pleasant and cooperative, no obvious  depression or anxiety, speech and thought processing grossly intact  ASSESSMENT AND PLAN:  Discussed the following assessment and plan:  Anemia, iron deficiency Doing well with current strength and dosing of adderall xr and adderall.  Will plan to continue.  PDMP reviewed today.   Sinusitis She will complete course of doxycycline.   Depression with anxiety Mild symptoms at this time.  She plans to incorporate exercise     I discussed the assessment and treatment plan with the patient. The patient was provided an opportunity to ask questions and all were answered. The patient agreed with the plan and demonstrated an understanding of the instructions.   The patient was advised to call back or seek an in-person evaluation if the symptoms worsen or if the condition fails to improve as anticipated.    Luetta Nutting, DO

## 2022-10-10 NOTE — Assessment & Plan Note (Signed)
Doing well with current strength and dosing of adderall xr and adderall.  Will plan to continue.  PDMP reviewed today.

## 2022-10-10 NOTE — Assessment & Plan Note (Signed)
Mild symptoms at this time.  She plans to incorporate exercise

## 2022-10-23 ENCOUNTER — Other Ambulatory Visit: Payer: Self-pay | Admitting: Family Medicine

## 2022-10-24 ENCOUNTER — Encounter: Payer: Self-pay | Admitting: Family Medicine

## 2022-12-04 ENCOUNTER — Other Ambulatory Visit: Payer: Self-pay | Admitting: Family Medicine

## 2022-12-05 MED ORDER — AMPHETAMINE-DEXTROAMPHET ER 25 MG PO CP24: 25.0000 mg | ORAL_CAPSULE | ORAL | 0 refills | Status: DC

## 2023-01-02 ENCOUNTER — Other Ambulatory Visit: Payer: Self-pay | Admitting: Family Medicine

## 2023-01-03 MED ORDER — AMPHETAMINE-DEXTROAMPHET ER 25 MG PO CP24: 25.0000 mg | ORAL_CAPSULE | ORAL | 0 refills | Status: DC

## 2023-01-18 ENCOUNTER — Other Ambulatory Visit: Payer: Self-pay | Admitting: Family Medicine

## 2023-01-18 MED ORDER — AMPHETAMINE-DEXTROAMPHETAMINE 20 MG PO TABS: 10.0000 mg | ORAL_TABLET | Freq: Every day | ORAL | 0 refills | Status: DC

## 2023-02-03 ENCOUNTER — Other Ambulatory Visit: Payer: Self-pay | Admitting: Family Medicine

## 2023-02-06 ENCOUNTER — Other Ambulatory Visit: Payer: Self-pay | Admitting: Family Medicine

## 2023-02-07 ENCOUNTER — Telehealth: Payer: Self-pay | Admitting: Family Medicine

## 2023-02-07 MED ORDER — AMPHETAMINE-DEXTROAMPHET ER 25 MG PO CP24: 25.0000 mg | ORAL_CAPSULE | ORAL | 0 refills | Status: DC

## 2023-02-07 NOTE — Telephone Encounter (Signed)
Patient called requesting a refill of amphetamine-dextroamphetamine (ADDERALL XR) 25 MG 24 hr capsule [[id number]]    Pharmacy -  [id number] - [place], [place] - [address]

## 2023-02-12 ENCOUNTER — Ambulatory Visit (INDEPENDENT_AMBULATORY_CARE_PROVIDER_SITE_OTHER): Payer: Managed Care, Other (non HMO) | Admitting: Family Medicine

## 2023-02-12 ENCOUNTER — Encounter: Payer: Self-pay | Admitting: Family Medicine

## 2023-02-12 VITALS — BP 135/86 | HR 93 | Ht 64.0 in | Wt 125.0 lb

## 2023-02-12 DIAGNOSIS — F988 Other specified behavioral and emotional disorders with onset usually occurring in childhood and adolescence: Secondary | ICD-10-CM | POA: Diagnosis not present

## 2023-02-12 MED ORDER — LISDEXAMFETAMINE DIMESYLATE 50 MG PO CAPS: 50.0000 mg | ORAL_CAPSULE | Freq: Every day | ORAL | 0 refills | Status: DC

## 2023-02-12 MED ORDER — AMPHETAMINE-DEXTROAMPHETAMINE 20 MG PO TABS: 10.0000 mg | ORAL_TABLET | Freq: Every day | ORAL | 0 refills | Status: DC

## 2023-02-12 NOTE — Progress Notes (Signed)
Krystal Hunter - 42 y.o. female MRN [id number]  Date of birth: 04/03/1981  Subjective Chief Complaint  Patient presents with   Medication Refill    HPI Krystal Hunter is a 42 y.o. female here today for follow up.   She reports that she feels a little jittery at times with current strength of adderall xr.  The afternoon strength seems to work better.  She felt better with Vyvanse but we changed due to issues with the Vyvansse being out of stock.  She would be interested in changing back to Vyvanse.  Her weight and appetite have been stable.  Sleeping pretty well.  BP is mildly elevated today.   ROS:  A comprehensive ROS was completed and negative except as noted per HPI  No Known Allergies  Past Medical History:  Diagnosis Date   Anxiety    Depression    Varicose veins     Past Surgical History:  Procedure Laterality Date   CHOLECYSTECTOMY      Social History   Socioeconomic History   Marital status: Married    Spouse name: Not on file   Number of children: 2   Years of education: Not on file   Highest education level: Not on file  Occupational History   Not on file  Tobacco Use   Smoking status: Former   Smokeless tobacco: Never  Vaping Use   Vaping Use: Never used  Substance and Sexual Activity   Alcohol use: Yes    Comment: Occ   Drug use: No   Sexual activity: Yes    Comment: Husband has vasectomy  Other Topics Concern   Not on file  Social History Narrative   Not on file   Social Determinants of Health   Financial Resource Strain: Not on file  Food Insecurity: Not on file  Transportation Needs: Not on file  Physical Activity: Sufficiently Active (02/26/2018)   Exercise Vital Sign    Days of Exercise per Week: 6 days    Minutes of Exercise per Session: 30 min  Stress: Not on file  Social Connections: Not on file    Family History  Problem Relation Age of Onset   Thyroid disease Mother     Health Maintenance  Topic Date Due   Hepatitis C  Screening  03/21/2023 (Originally 07/12/1998)   COVID-19 Vaccine (3 - 2023-24 season) 05/01/2023 (Originally 04/28/2022)   PAP SMEAR-Modifier  08/14/2023 (Originally 06/01/2019)   INFLUENZA VACCINE  03/29/2023   DTaP/Tdap/Td (2 - Td or Tdap) 05/11/2026   HIV Screening  Completed   HPV VACCINES  Aged Out     ----------------------------------------------------------------------------------------------------------------------------------------------------------------------------------------------------------------- Physical Exam BP 135/86 (BP Location: Left Arm, Patient Position: Sitting, Cuff Size: Small)   Pulse 93   Ht 5\' 4"  (1.626 m)   Wt 125 lb (56.7 kg)   SpO2 100%   BMI 21.46 kg/m   Physical Exam Constitutional:      Appearance: Normal appearance.  HENT:     Head: Normocephalic and atraumatic.  Eyes:     General: No scleral icterus. Cardiovascular:     Rate and Rhythm: Normal rate and regular rhythm.  Pulmonary:     Effort: Pulmonary effort is normal.     Breath sounds: Normal breath sounds.  Musculoskeletal:     Cervical back: Neck supple.  Neurological:     Mental Status: She is alert.  Psychiatric:        Mood and Affect: Mood normal.        Behavior: Behavior  normal.     ------------------------------------------------------------------------------------------------------------------------------------------------------------------------------------------------------------------- Assessment and Plan  Attention deficit disorder (ADD) in adult She is having side effects from Adderall XR at current strength. She did better with Vyvanse.  Will send rx in for Vyvanse now that generic is available.  Sending to mail order pharmacy. Continue adderall IR in the afternoon.  She will let me know if she has any problems with this.    Meds ordered this encounter  Medications   lisdexamfetamine (VYVANSE) 50 MG capsule    Sig: Take 1 capsule (50 mg total) by mouth daily.     Dispense:  90 capsule    Refill:  0   amphetamine-dextroamphetamine (ADDERALL) 20 MG tablet    Sig: Take 0.5 tablets (10 mg total) by mouth daily in the afternoon. Take in the afternoon as needed.    Dispense:  45 tablet    Refill:  0    Return in about 6 months (around 08/14/2023) for F/u ADD.    This visit occurred during the SARS-CoV-2 public health emergency.  Safety protocols were in place, including screening questions prior to the visit, additional usage of staff PPE, and extensive cleaning of exam room while observing appropriate contact time as indicated for disinfecting solutions.

## 2023-02-12 NOTE — Patient Instructions (Signed)
Let's change back to vyvanse You may conitnue adderall IR in the afternoon.  Let me know if you have any issues with this.

## 2023-02-12 NOTE — Assessment & Plan Note (Signed)
She is having side effects from Adderall XR at current strength. She did better with Vyvanse.  Will send rx in for Vyvanse now that generic is available.  Sending to mail order pharmacy. Continue adderall IR in the afternoon.  She will let me know if she has any problems with this.

## 2023-03-06 ENCOUNTER — Encounter: Payer: Self-pay | Admitting: Family Medicine

## 2023-03-06 MED ORDER — LISDEXAMFETAMINE DIMESYLATE 50 MG PO CAPS: 50.0000 mg | ORAL_CAPSULE | ORAL | 0 refills | Status: DC

## 2023-03-06 MED ORDER — LISDEXAMFETAMINE DIMESYLATE 50 MG PO CAPS: 50.0000 mg | ORAL_CAPSULE | Freq: Every day | ORAL | 0 refills | Status: DC

## 2023-03-09 MED ORDER — LISDEXAMFETAMINE DIMESYLATE 50 MG PO CAPS: 50.0000 mg | ORAL_CAPSULE | ORAL | 0 refills | Status: DC

## 2023-03-09 MED ORDER — LISDEXAMFETAMINE DIMESYLATE 50 MG PO CAPS: 50.0000 mg | ORAL_CAPSULE | Freq: Every day | ORAL | 0 refills | Status: DC

## 2023-04-03 ENCOUNTER — Other Ambulatory Visit: Payer: Self-pay | Admitting: Family Medicine

## 2023-04-06 ENCOUNTER — Encounter: Payer: Self-pay | Admitting: Family Medicine

## 2023-04-06 ENCOUNTER — Other Ambulatory Visit: Payer: Self-pay | Admitting: Family Medicine

## 2023-04-06 NOTE — Telephone Encounter (Signed)
Spoke with pharmacy - they do have refills of Vyvanse but pt is trying to fill too early. Per pharmacy can be filled 04/08/2023.  Called patient and informed as above.

## 2023-06-05 ENCOUNTER — Other Ambulatory Visit: Payer: Self-pay | Admitting: Family Medicine

## 2023-06-06 MED ORDER — LISDEXAMFETAMINE DIMESYLATE 50 MG PO CAPS: 50.0000 mg | ORAL_CAPSULE | ORAL | 0 refills | Status: DC

## 2023-06-06 MED ORDER — LISDEXAMFETAMINE DIMESYLATE 50 MG PO CAPS: 50.0000 mg | ORAL_CAPSULE | Freq: Every day | ORAL | 0 refills | Status: DC

## 2023-07-10 ENCOUNTER — Other Ambulatory Visit: Payer: Self-pay | Admitting: Family Medicine

## 2023-07-13 MED ORDER — AMPHETAMINE-DEXTROAMPHETAMINE 20 MG PO TABS: 10.0000 mg | ORAL_TABLET | Freq: Every day | ORAL | 0 refills | Status: DC

## 2023-09-04 ENCOUNTER — Other Ambulatory Visit: Payer: Self-pay | Admitting: Family Medicine

## 2023-09-04 MED ORDER — LISDEXAMFETAMINE DIMESYLATE 50 MG PO CAPS: 50.0000 mg | ORAL_CAPSULE | Freq: Every day | ORAL | 0 refills | Status: DC

## 2023-09-04 NOTE — Telephone Encounter (Signed)
 Patient scheduled for 09/19/23, thanks.

## 2023-09-04 NOTE — Telephone Encounter (Signed)
 Pls contact the pt to schedule Adderall refill appt. Sending 30 day refill. No additional without kept appt. Thanks

## 2023-09-19 ENCOUNTER — Ambulatory Visit (INDEPENDENT_AMBULATORY_CARE_PROVIDER_SITE_OTHER): Payer: Managed Care, Other (non HMO) | Admitting: Family Medicine

## 2023-09-19 VITALS — BP 147/84 | HR 96 | Ht 64.0 in | Wt 125.0 lb

## 2023-09-19 DIAGNOSIS — F418 Other specified anxiety disorders: Secondary | ICD-10-CM

## 2023-09-19 DIAGNOSIS — F988 Other specified behavioral and emotional disorders with onset usually occurring in childhood and adolescence: Secondary | ICD-10-CM

## 2023-09-19 DIAGNOSIS — Z23 Encounter for immunization: Secondary | ICD-10-CM | POA: Diagnosis not present

## 2023-09-19 MED ORDER — LISDEXAMFETAMINE DIMESYLATE 40 MG PO CAPS: 40.0000 mg | ORAL_CAPSULE | ORAL | 0 refills | Status: DC

## 2023-09-19 MED ORDER — AMPHETAMINE-DEXTROAMPHETAMINE 20 MG PO TABS: 10.0000 mg | ORAL_TABLET | Freq: Every day | ORAL | 0 refills | Status: DC

## 2023-09-19 MED ORDER — LISDEXAMFETAMINE DIMESYLATE 40 MG PO CAPS: 40.0000 mg | ORAL_CAPSULE | Freq: Every day | ORAL | 0 refills | Status: DC

## 2023-09-19 MED ORDER — SERTRALINE HCL 25 MG PO TABS: 12.5000 mg | ORAL_TABLET | Freq: Every day | ORAL | 1 refills | Status: DC

## 2023-09-19 NOTE — Assessment & Plan Note (Signed)
Reducing vyvanse to 40mg  daily.  Continue adderall 10mg  as needed in the afternoon.

## 2023-09-19 NOTE — Assessment & Plan Note (Signed)
She has some dysphoric mood that is typically worse in Winter. Adding 12.5mg  sertraline daily.  She will let me know if this is not effective.

## 2023-09-19 NOTE — Progress Notes (Signed)
Krystal Hunter - 43 y.o. female MRN [id number]  Date of birth: [birth year]  Subjective Chief Complaint  Patient presents with   Medical Management of Chronic Issues    HPI Krystal Hunter is a 43 y.o. female here today for follow up visit.   She continues on vyvanse 50mg  daily with adderall in the afternoon as needed.  She feels that this is working pretty well but she feels a little jittery at the 50mg .  She would like to try a reduction in strength of this.   She feels that she may has some seasonal affective disorder as she feels a little less energetic with depressed mood in the winter as well.  She had been on sertraline in the past and would like to try adding this back on at low strength.   ROS:  A comprehensive ROS was completed and negative except as noted per HPI  No Known Allergies  Past Medical History:  Diagnosis Date   Anxiety    Depression    Varicose veins     Past Surgical History:  Procedure Laterality Date   CHOLECYSTECTOMY      Social History   Socioeconomic History   Marital status: Married    Spouse name: Not on file   Number of children: 2   Years of education: Not on file   Highest education level: Bachelor's degree (e.g., BA, AB, BS)  Occupational History   Not on file  Tobacco Use   Smoking status: Former   Smokeless tobacco: Never  Vaping Use   Vaping status: Never Used  Substance and Sexual Activity   Alcohol use: Yes    Comment: Occ   Drug use: No   Sexual activity: Yes    Comment: Husband has vasectomy  Other Topics Concern   Not on file  Social History Narrative   Not on file   Social Drivers of Health   Financial Resource Strain: Low Risk  (09/19/2023)   Overall Financial Resource Strain (CARDIA)    Difficulty of Paying Living Expenses: Not hard at all  Food Insecurity: No Food Insecurity (09/19/2023)   Hunger Vital Sign    Worried About Running Out of Food in the Last Year: Never true    Ran Out of Food in the Last Year:  Never true  Transportation Needs: No Transportation Needs (09/19/2023)   PRAPARE - Administrator, Civil Service (Medical): No    Lack of Transportation (Non-Medical): No  Physical Activity: Insufficiently Active (09/19/2023)   Exercise Vital Sign    Days of Exercise per Week: 2 days    Minutes of Exercise per Session: 20 min  Stress: Stress Concern Present (09/19/2023)   [employer] of Occupational Health - Occupational Stress Questionnaire    Feeling of Stress : To some extent  Social Connections: Moderately Integrated (09/19/2023)   Social Connection and Isolation Panel [NHANES]    Frequency of Communication with Friends and Family: More than three times a week    Frequency of Social Gatherings with Friends and Family: Twice a week    Attends Religious Services: 1 to 4 times per year    Active Member of [employer] or Organizations: No    Attends Engineer, structural: Not on file    Marital Status: Married    Family History  Problem Relation Age of Onset   Thyroid disease Mother     Health Maintenance  Topic Date Due   Hepatitis C Screening  Never done  Cervical Cancer Screening (HPV/Pap Cotest)  06/01/2019   INFLUENZA VACCINE  03/29/2023   COVID-19 Vaccine (3 - 2024-25 season) 04/29/2023   DTaP/Tdap/Td (2 - Td or Tdap) 05/11/2026   HIV Screening  Completed   HPV VACCINES  Aged Out     ----------------------------------------------------------------------------------------------------------------------------------------------------------------------------------------------------------------- Physical Exam BP (!) 147/84 (BP Location: Left Arm, Patient Position: Sitting, Cuff Size: Normal)   Pulse 96   Ht 5\' 4"  (1.626 m)   Wt 125 lb (56.7 kg)   SpO2 100%   BMI 21.46 kg/m   Physical Exam Constitutional:      Appearance: Normal appearance.  Cardiovascular:     Rate and Rhythm: Normal rate and regular rhythm.  Pulmonary:     Effort: Pulmonary  effort is normal.     Breath sounds: Normal breath sounds.  Neurological:     Mental Status: She is alert.  Psychiatric:        Mood and Affect: Mood normal.        Behavior: Behavior normal.     ------------------------------------------------------------------------------------------------------------------------------------------------------------------------------------------------------------------- Assessment and Plan  Depression with anxiety She has some dysphoric mood that is typically worse in Winter. Adding 12.5mg  sertraline daily.  She will let me know if this is not effective.   Attention deficit disorder (ADD) in adult Reducing vyvanse to 40mg  daily.  Continue adderall 10mg  as needed in the afternoon.    Meds ordered this encounter  Medications   sertraline (ZOLOFT) 25 MG tablet    Sig: Take 0.5 tablets (12.5 mg total) by mouth daily.    Dispense:  45 tablet    Refill:  1   amphetamine-dextroamphetamine (ADDERALL) 20 MG tablet    Sig: Take 0.5 tablets (10 mg total) by mouth daily in the afternoon. Take in the afternoon as needed.    Dispense:  45 tablet    Refill:  0   lisdexamfetamine (VYVANSE) 40 MG capsule    Sig: Take 1 capsule (40 mg total) by mouth every morning.    Dispense:  30 capsule    Refill:  0    Fill Now   lisdexamfetamine (VYVANSE) 40 MG capsule    Sig: Take 1 capsule (40 mg total) by mouth every morning.    Dispense:  30 capsule    Refill:  0    Fill in 30 days   lisdexamfetamine (VYVANSE) 40 MG capsule    Sig: Take 1 capsule (40 mg total) by mouth daily.    Dispense:  30 capsule    Refill:  0    Fill in 60 days    Return in about 6 months (around 03/18/2024) for ADHD.    This visit occurred during the SARS-CoV-2 public health emergency.  Safety protocols were in place, including screening questions prior to the visit, additional usage of staff PPE, and extensive cleaning of exam room while observing appropriate contact time as indicated  for disinfecting solutions.

## 2023-12-26 ENCOUNTER — Other Ambulatory Visit: Payer: Self-pay | Admitting: Family Medicine

## 2023-12-27 MED ORDER — LISDEXAMFETAMINE DIMESYLATE 40 MG PO CAPS: 40.0000 mg | ORAL_CAPSULE | ORAL | 0 refills | Status: DC

## 2024-01-09 ENCOUNTER — Other Ambulatory Visit: Payer: Self-pay | Admitting: Family Medicine

## 2024-01-09 MED ORDER — AMPHETAMINE-DEXTROAMPHETAMINE 20 MG PO TABS: 10.0000 mg | ORAL_TABLET | Freq: Every day | ORAL | 0 refills | Status: DC

## 2024-01-14 ENCOUNTER — Other Ambulatory Visit: Payer: Self-pay | Admitting: Family Medicine

## 2024-02-05 ENCOUNTER — Encounter: Payer: Self-pay | Admitting: Family Medicine

## 2024-02-05 MED ORDER — LISDEXAMFETAMINE DIMESYLATE 40 MG PO CAPS: 40.0000 mg | ORAL_CAPSULE | ORAL | 0 refills | Status: DC

## 2024-02-05 NOTE — Telephone Encounter (Signed)
 Last OV: 09/19/23 Next OV: 02/19/24 Last RF: 12/27/23

## 2024-02-12 MED ORDER — LISDEXAMFETAMINE DIMESYLATE 30 MG PO CAPS
30.0000 mg | ORAL_CAPSULE | Freq: Every day | ORAL | 0 refills | Status: DC
Start: 2024-02-12 — End: 2024-02-27

## 2024-02-12 NOTE — Addendum Note (Signed)
 Addended by: Abundio Hoit on: 02/12/2024 12:03 PM   Modules accepted: Orders

## 2024-02-19 ENCOUNTER — Encounter: Admitting: Family Medicine

## 2024-02-19 NOTE — Progress Notes (Signed)
Attempted to contact the pt. No answer 

## 2024-02-19 NOTE — Progress Notes (Signed)
 This encounter was created in error - please disregard.

## 2024-02-27 ENCOUNTER — Telehealth (INDEPENDENT_AMBULATORY_CARE_PROVIDER_SITE_OTHER): Admitting: Family Medicine

## 2024-02-27 ENCOUNTER — Encounter: Payer: Self-pay | Admitting: Family Medicine

## 2024-02-27 DIAGNOSIS — F988 Other specified behavioral and emotional disorders with onset usually occurring in childhood and adolescence: Secondary | ICD-10-CM | POA: Diagnosis not present

## 2024-02-27 DIAGNOSIS — F418 Other specified anxiety disorders: Secondary | ICD-10-CM

## 2024-02-27 MED ORDER — LISDEXAMFETAMINE DIMESYLATE 40 MG PO CAPS: 40.0000 mg | ORAL_CAPSULE | ORAL | 0 refills | Status: DC

## 2024-02-27 MED ORDER — LISDEXAMFETAMINE DIMESYLATE 40 MG PO CAPS: 40.0000 mg | ORAL_CAPSULE | Freq: Every day | ORAL | 0 refills | Status: DC

## 2024-02-27 NOTE — Progress Notes (Signed)
 Krystal Hunter - 43 y.o. female MRN [id number]  Date of birth: 1980/10/23   All issues noted in this document were discussed and addressed.  No physical exam was performed (except for noted visual exam findings with Video Visits).  I discussed the limitations of evaluation and management by telemedicine and the availability of in person appointments. The patient expressed understanding and agreed to proceed.  I connected withNAME@ on 02/27/24 at  1:30 PM EDT by a video enabled telemedicine application and verified that I am speaking with the correct person using two identifiers.  Present at visit: Velma Ku, DO [place]   Patient Location: Home   [address] [place] [address]   Provider location:   [facility]  Chief Complaint  Patient presents with   Medication Refill    HPI  Krystal Hunter is a 43 y.o. female who presents via audio/video conferencing for a telehealth visit today.  She reports that she continues to do well with Vyvanse  40mg  daily.  Tolerating well at current strength without side effects.  She has stopped sertraline  as she usually only needs this seasonally.  She does have a friend who is ill so Is feeling a little down about this.     ROS:  A comprehensive ROS was completed and negative except as noted per HPI  Past Medical History:  Diagnosis Date   Anxiety    Depression    Varicose veins     Past Surgical History:  Procedure Laterality Date   CHOLECYSTECTOMY      Family History  Problem Relation Age of Onset   Thyroid disease Mother     Social History   Socioeconomic History   Marital status: Married    Spouse name: Not on file   Number of children: 2   Years of education: Not on file   Highest education level: Bachelor's degree (e.g., BA, AB, BS)  Occupational History   Not on file  Tobacco Use   Smoking status: Former   Smokeless tobacco: Never  Vaping Use   Vaping status: Never Used  Substance and Sexual  Activity   Alcohol use: Yes    Comment: Occ   Drug use: No   Sexual activity: Yes    Comment: Husband has vasectomy  Other Topics Concern   Not on file  Social History Narrative   Not on file   Social Drivers of Health   Financial Resource Strain: Low Risk  (09/19/2023)   Overall Financial Resource Strain (CARDIA)    Difficulty of Paying Living Expenses: Not hard at all  Food Insecurity: No Food Insecurity (09/19/2023)   Hunger Vital Sign    Worried About Running Out of Food in the Last Year: Never true    Ran Out of Food in the Last Year: Never true  Transportation Needs: No Transportation Needs (09/19/2023)   PRAPARE - Administrator, Civil Service (Medical): No    Lack of Transportation (Non-Medical): No  Physical Activity: Insufficiently Active (09/19/2023)   Exercise Vital Sign    Days of Exercise per Week: 2 days    Minutes of Exercise per Session: 20 min  Stress: Stress Concern Present (09/19/2023)   [employer] of Occupational Health - Occupational Stress Questionnaire    Feeling of Stress : To some extent  Social Connections: Moderately Integrated (09/19/2023)   Social Connection and Isolation Panel    Frequency of Communication with Friends and Family: More than three times a week    Frequency  of Social Gatherings with Friends and Family: Twice a week    Attends Religious Services: 1 to 4 times per year    Active Member of [employer] or Organizations: No    Attends Engineer, structural: Not on file    Marital Status: Married  Catering manager Violence: Not on file     Current Outpatient Medications:    amphetamine -dextroamphetamine  (ADDERALL) 20 MG tablet, Take 0.5 tablets (10 mg total) by mouth daily in the afternoon. Take in the afternoon as needed., Disp: 45 tablet, Rfl: 0   lisdexamfetamine (VYVANSE ) 40 MG capsule, Take 1 capsule (40 mg total) by mouth every morning., Disp: 30 capsule, Rfl: 0   [START ON 03/28/2024] lisdexamfetamine (VYVANSE )  40 MG capsule, Take 1 capsule (40 mg total) by mouth every morning., Disp: 30 capsule, Rfl: 0   [START ON 04/29/2024] lisdexamfetamine (VYVANSE ) 40 MG capsule, Take 1 capsule (40 mg total) by mouth daily., Disp: 30 capsule, Rfl: 0   meloxicam  (MOBIC ) 15 MG tablet, One tab PO qAM with a meal for 2 weeks, then daily prn pain. (Patient not taking: Reported on 02/27/2024), Disp: 30 tablet, Rfl: 3   sertraline  (ZOLOFT ) 25 MG tablet, TAKE 1/2 TABLET BY MOUTH EVERY DAY (Patient not taking: Reported on 02/27/2024), Disp: 45 tablet, Rfl: 1  EXAM:  VITALS per patient if applicable: Ht 5' 4 (1.626 m)   Wt 125 lb (56.7 kg)   BMI 21.46 kg/m   GENERAL: alert, oriented, appears well and in no acute distress  HEENT: atraumatic, conjunttiva clear, no obvious abnormalities on inspection of external nose and ears  NECK: normal movements of the head and neck  LUNGS: on inspection no signs of respiratory distress, breathing rate appears normal, no obvious gross SOB, gasping or wheezing  CV: no obvious cyanosis  MS: moves all visible extremities without noticeable abnormality  PSYCH/NEURO: pleasant and cooperative, no obvious depression or anxiety, speech and thought processing grossly intact  ASSESSMENT AND PLAN:  Discussed the following assessment and plan:  Depression with anxiety Off sertraline  at this time.  Doing better with longer days and more sunlight exposure.    Attention deficit disorder (ADD) in adult Reducing vyvanse  to 40mg  daily.  Continue adderall 10mg  as needed in the afternoon.      I discussed the assessment and treatment plan with the patient. The patient was provided an opportunity to ask questions and all were answered. The patient agreed with the plan and demonstrated an understanding of the instructions.   The patient was advised to call back or seek an in-person evaluation if the symptoms worsen or if the condition fails to improve as anticipated.    Velma Ku, DO

## 2024-02-27 NOTE — Assessment & Plan Note (Signed)
 Reducing vyvanse to 40mg  daily.  Continue adderall 10mg  as needed in the afternoon.

## 2024-02-27 NOTE — Assessment & Plan Note (Signed)
 Off sertraline  at this time.  Doing better with longer days and more sunlight exposure.

## 2024-03-19 ENCOUNTER — Ambulatory Visit: Payer: Managed Care, Other (non HMO) | Admitting: Family Medicine

## 2024-04-10 ENCOUNTER — Encounter: Payer: Self-pay | Admitting: Family Medicine

## 2024-04-11 MED ORDER — AMPHETAMINE-DEXTROAMPHETAMINE 20 MG PO TABS: 10.0000 mg | ORAL_TABLET | Freq: Every day | ORAL | 0 refills | Status: DC

## 2024-04-29 ENCOUNTER — Encounter: Payer: Self-pay | Admitting: Sports Medicine

## 2024-06-22 ENCOUNTER — Encounter: Payer: Self-pay | Admitting: Family Medicine

## 2024-06-22 DIAGNOSIS — F418 Other specified anxiety disorders: Secondary | ICD-10-CM

## 2024-06-23 ENCOUNTER — Other Ambulatory Visit: Payer: Self-pay | Admitting: Family Medicine

## 2024-06-23 DIAGNOSIS — F988 Other specified behavioral and emotional disorders with onset usually occurring in childhood and adolescence: Secondary | ICD-10-CM

## 2024-06-23 DIAGNOSIS — F418 Other specified anxiety disorders: Secondary | ICD-10-CM

## 2024-06-23 MED ORDER — LISDEXAMFETAMINE DIMESYLATE 40 MG PO CAPS: 40.0000 mg | ORAL_CAPSULE | Freq: Every day | ORAL | 0 refills | Status: AC

## 2024-06-23 MED ORDER — LISDEXAMFETAMINE DIMESYLATE 40 MG PO CAPS: 40.0000 mg | ORAL_CAPSULE | ORAL | 0 refills | Status: AC

## 2024-06-23 MED ORDER — LISDEXAMFETAMINE DIMESYLATE 40 MG PO CAPS: 40.0000 mg | ORAL_CAPSULE | ORAL | 0 refills | Status: DC

## 2024-07-14 ENCOUNTER — Encounter: Payer: Self-pay | Admitting: Family Medicine

## 2024-07-14 MED ORDER — AMPHETAMINE-DEXTROAMPHETAMINE 20 MG PO TABS
10.0000 mg | ORAL_TABLET | Freq: Every day | ORAL | 0 refills | Status: AC
Start: 2024-07-14 — End: ?

## 2024-07-21 ENCOUNTER — Encounter: Payer: Self-pay | Admitting: Family Medicine

## 2024-07-21 DIAGNOSIS — F988 Other specified behavioral and emotional disorders with onset usually occurring in childhood and adolescence: Secondary | ICD-10-CM

## 2024-07-21 DIAGNOSIS — F418 Other specified anxiety disorders: Secondary | ICD-10-CM

## 2024-07-21 MED ORDER — LISDEXAMFETAMINE DIMESYLATE 40 MG PO CAPS: 40.0000 mg | ORAL_CAPSULE | ORAL | 0 refills | Status: DC

## 2024-08-26 ENCOUNTER — Encounter: Payer: Self-pay | Admitting: Family Medicine

## 2024-08-27 ENCOUNTER — Other Ambulatory Visit: Payer: Self-pay | Admitting: Family Medicine

## 2024-09-09 ENCOUNTER — Encounter: Payer: Self-pay | Admitting: Family Medicine

## 2024-09-09 DIAGNOSIS — F418 Other specified anxiety disorders: Secondary | ICD-10-CM

## 2024-09-09 DIAGNOSIS — F988 Other specified behavioral and emotional disorders with onset usually occurring in childhood and adolescence: Secondary | ICD-10-CM

## 2024-09-09 NOTE — Telephone Encounter (Signed)
 Requesting rx rf of vyvanse  40mg   Last written 08/22/2024 Last OV 02/27/2024 Upcoming appt = none

## 2024-09-10 MED ORDER — LISDEXAMFETAMINE DIMESYLATE 40 MG PO CAPS: 40.0000 mg | ORAL_CAPSULE | ORAL | 0 refills | Status: AC
